# Patient Record
Sex: Female | Born: 1965 | Race: Black or African American | Hispanic: No | Marital: Married | Smoking: Never smoker
Health system: Southern US, Community
[De-identification: ages and names within clinical notes are randomized; demographics above are authoritative.]

## PROBLEM LIST (undated history)

## (undated) ENCOUNTER — Emergency Department (HOSPITAL_BASED_OUTPATIENT_CLINIC_OR_DEPARTMENT_OTHER): Payer: BC Managed Care – PPO | Source: Home / Self Care

## (undated) DIAGNOSIS — E119 Type 2 diabetes mellitus without complications: Secondary | ICD-10-CM

## (undated) DIAGNOSIS — C801 Malignant (primary) neoplasm, unspecified: Secondary | ICD-10-CM

## (undated) DIAGNOSIS — I10 Essential (primary) hypertension: Secondary | ICD-10-CM

## (undated) HISTORY — PX: ABDOMINAL HYSTERECTOMY: SHX81

---

## 2006-07-19 HISTORY — PX: BREAST SURGERY: SHX581

## 2009-04-02 ENCOUNTER — Ambulatory Visit: Payer: Self-pay | Admitting: Family Medicine

## 2009-04-02 DIAGNOSIS — I10 Essential (primary) hypertension: Secondary | ICD-10-CM | POA: Insufficient documentation

## 2009-04-02 DIAGNOSIS — J019 Acute sinusitis, unspecified: Secondary | ICD-10-CM | POA: Insufficient documentation

## 2010-06-29 ENCOUNTER — Ambulatory Visit
Admission: RE | Admit: 2010-06-29 | Discharge: 2010-06-30 | Payer: Self-pay | Source: Home / Self Care | Attending: Specialist | Admitting: Specialist

## 2010-09-29 LAB — BASIC METABOLIC PANEL
BUN: 8 mg/dL (ref 6–23)
Calcium: 9.3 mg/dL (ref 8.4–10.5)
GFR calc non Af Amer: >60 mL/min (ref >60)
Glucose, Bld: 99 mg/dL (ref 70–99)
Potassium: 5.1 mEq/L (ref 3.5–5.1)
Sodium: 138 mEq/L (ref 135–145)

## 2010-09-29 LAB — DIFFERENTIAL
Basophils Absolute: 0 10*3/uL (ref 0.0–0.1)
Basophils Relative: 0 % (ref 0–1)
Lymphocytes Relative: 38 % (ref 12–46)
Neutro Abs: 6 10*3/uL (ref 1.7–7.7)
Neutrophils Relative %: 55 % (ref 43–77)

## 2010-09-29 LAB — CBC
MCHC: 33.9 g/dL (ref 30.0–36.0)
RDW: 14 % (ref 11.5–15.5)
WBC: 10.8 10*3/uL — ABNORMAL HIGH (ref 4.0–10.5)

## 2010-09-29 LAB — POCT HEMOGLOBIN-HEMACUE: Hemoglobin: 14.5 g/dL (ref 12.0–15.0)

## 2010-11-02 ENCOUNTER — Ambulatory Visit (HOSPITAL_BASED_OUTPATIENT_CLINIC_OR_DEPARTMENT_OTHER): Admission: RE | Admit: 2010-11-02 | Payer: BC Managed Care – PPO | Source: Ambulatory Visit | Admitting: Specialist

## 2011-02-06 ENCOUNTER — Encounter: Payer: Self-pay | Admitting: *Deleted

## 2011-02-06 ENCOUNTER — Emergency Department (INDEPENDENT_AMBULATORY_CARE_PROVIDER_SITE_OTHER): Payer: BC Managed Care – PPO

## 2011-02-06 ENCOUNTER — Emergency Department (HOSPITAL_BASED_OUTPATIENT_CLINIC_OR_DEPARTMENT_OTHER)
Admission: EM | Admit: 2011-02-06 | Discharge: 2011-02-06 | Disposition: A | Discharge status: Home / Self Care | Payer: BC Managed Care – PPO | Attending: Emergency Medicine | Admitting: Emergency Medicine

## 2011-02-06 DIAGNOSIS — M25562 Pain in left knee: Secondary | ICD-10-CM

## 2011-02-06 DIAGNOSIS — M25569 Pain in unspecified knee: Secondary | ICD-10-CM | POA: Insufficient documentation

## 2011-02-06 HISTORY — DX: Malignant (primary) neoplasm, unspecified: C80.1

## 2011-02-06 MED ORDER — IBUPROFEN 800 MG PO TABS: 800.0000 mg | ORAL_TABLET | Freq: Three times a day (TID) | ORAL | Status: AC

## 2011-02-06 NOTE — ED Notes (Signed)
Pt states her left knee gave out on her earlier today.

## 2011-02-06 NOTE — ED Provider Notes (Signed)
History     Chief Complaint  Patient presents with  . Knee Pain   HPI Comments: Patient describes walking up the stairs several hours ago when her left knee "gave out". This happened acutely, and was followed by pain with flexing her knee. She has had no numbness, weakness, tingling of the left lower extremity. The pain is constant, mild at rest, moderate with movement of the knee. It is associated with mild swelling of the knee. She denies any other trauma to the knee. When asked about the location of the pain she points to the suprapatellar area.  Patient is a 45 y.o. female presenting with knee pain. The history is provided by the patient and a relative.  Knee Pain    Past Medical History  Diagnosis Date  . Cancer     Past Surgical History  Procedure Date  . Breast surgery   . Abdominal hysterectomy     History reviewed. No pertinent family history.  History  Substance Use Topics  . Smoking status: Never Smoker   . Smokeless tobacco: Not on file  . Alcohol Use: No    OB History    Grav Para Term Preterm Abortions TAB SAB Ect Mult Living                  Review of Systems  Musculoskeletal: Positive for joint swelling.  Neurological: Negative for weakness and numbness.    Physical Exam  BP 154/111  Pulse 100  Temp(Src) 99.3 F (37.4 C) (Oral)  Resp 20  Ht 5\' 5"  (1.651 m)  Wt 195 lb (88.451 kg)  BMI 32.45 kg/m2  SpO2 100%  Physical Exam  Nursing note and vitals reviewed. Constitutional: She appears well-developed and well-nourished.  HENT:  Head: Normocephalic and atraumatic.  Eyes: Conjunctivae are normal. No scleral icterus.  Cardiovascular: Normal rate and regular rhythm.   Pulmonary/Chest: Effort normal and breath sounds normal.  Musculoskeletal: She exhibits no edema. Tenderness:  Tenderness to palpation of the suprapatellar area. Patient is able to straight leg raise the left leg with minimal difficulty. She has slight decreased range of motion of  left knee secondary to pain. Clinical effusion or edema of the lower extremity on th.  Neurological: She is alert. Coordination normal.  Skin: Skin is warm and dry. No rash noted.    ED Course  Procedures  MDM Overall patient appears well without signs of infectious etiology for knee pain. There is no obvious trauma to the knee however I suspect there may be internal injury. The knee appears stable on anterior posterior exam and mediolateral stresses. Will obtain imaging to rule out other source however anticipate immobilization with Ace wrap, rest therapy, followup with orthopedics if not better in one week. She declines pain medication at this time due to taking ibuprofen just prior to arrival.  Imaging negative for fracture or dislocation. Immobilization with Ace wrap applied. Followup instructions given.   Dg Knee 1-2 Views Left  02/06/2011  *RADIOLOGY REPORT*  Clinical Data: Acute onset left knee pain.  LEFT KNEE - 1-2 VIEW  Comparison: None.  Findings: No displaced acute fracture or dislocation identified. No aggressive appearing osseous lesion.  No appreciable joint effusion.  IMPRESSION: No acute osseous abnormality.  Original Report Authenticated By: Waneta Martins, M.D.         Vida Roller, MD 02/06/11 [phone]

## 2012-09-03 ENCOUNTER — Emergency Department (HOSPITAL_BASED_OUTPATIENT_CLINIC_OR_DEPARTMENT_OTHER)
Admission: EM | Admit: 2012-09-03 | Discharge: 2012-09-03 | Disposition: A | Discharge status: Home / Self Care | Payer: BC Managed Care – PPO | Attending: Emergency Medicine | Admitting: Emergency Medicine

## 2012-09-03 ENCOUNTER — Encounter (HOSPITAL_BASED_OUTPATIENT_CLINIC_OR_DEPARTMENT_OTHER): Payer: Self-pay | Admitting: Emergency Medicine

## 2012-09-03 ENCOUNTER — Emergency Department (HOSPITAL_BASED_OUTPATIENT_CLINIC_OR_DEPARTMENT_OTHER): Payer: BC Managed Care – PPO

## 2012-09-03 DIAGNOSIS — R059 Cough, unspecified: Secondary | ICD-10-CM | POA: Insufficient documentation

## 2012-09-03 DIAGNOSIS — Z79899 Other long term (current) drug therapy: Secondary | ICD-10-CM | POA: Insufficient documentation

## 2012-09-03 DIAGNOSIS — R6883 Chills (without fever): Secondary | ICD-10-CM | POA: Insufficient documentation

## 2012-09-03 DIAGNOSIS — Z8709 Personal history of other diseases of the respiratory system: Secondary | ICD-10-CM | POA: Insufficient documentation

## 2012-09-03 DIAGNOSIS — J3489 Other specified disorders of nose and nasal sinuses: Secondary | ICD-10-CM | POA: Insufficient documentation

## 2012-09-03 DIAGNOSIS — R509 Fever, unspecified: Secondary | ICD-10-CM | POA: Insufficient documentation

## 2012-09-03 DIAGNOSIS — Z859 Personal history of malignant neoplasm, unspecified: Secondary | ICD-10-CM | POA: Insufficient documentation

## 2012-09-03 DIAGNOSIS — I10 Essential (primary) hypertension: Secondary | ICD-10-CM | POA: Insufficient documentation

## 2012-09-03 DIAGNOSIS — R05 Cough: Secondary | ICD-10-CM | POA: Insufficient documentation

## 2012-09-03 DIAGNOSIS — E669 Obesity, unspecified: Secondary | ICD-10-CM | POA: Insufficient documentation

## 2012-09-03 DIAGNOSIS — B9789 Other viral agents as the cause of diseases classified elsewhere: Secondary | ICD-10-CM

## 2012-09-03 HISTORY — DX: Essential (primary) hypertension: I10

## 2012-09-03 LAB — RAPID STREP SCREEN (MED CTR MEBANE ONLY): Streptococcus, Group A Screen (Direct): NEGATIVE

## 2012-09-03 MED ORDER — OXYMETAZOLINE HCL 0.05 % NA SOLN: 2.0000 | Freq: Two times a day (BID) | NASAL | Status: DC

## 2012-09-03 MED ORDER — AZITHROMYCIN 250 MG PO TABS: ORAL_TABLET | ORAL | Status: DC

## 2012-09-03 MED ORDER — MOMETASONE FUROATE 50 MCG/ACT NA SUSP: 2.0000 | Freq: Every day | NASAL | Status: DC

## 2012-09-03 NOTE — ED Provider Notes (Signed)
History     CSN: [id number]  Arrival date & time 09/03/12  4098   First MD Initiated Contact with Patient 09/03/12 1016      Chief Complaint  Patient presents with  . Sore Throat  . Nasal Congestion    (Consider location/radiation/quality/duration/timing/severity/associated sxs/prior treatment) HPI Comments: 47 y.o PMH sinusitis, HTN she presents with sore throat 8/10.  Wednesday her throat was scratchy and progressed to being sore.  She also has sensation of ears popping, nasal congestion, and runny nose with clear to yellow d/c, cough with clear sputum.  She denies sick contacts.    Patient is a 47 y.o. female presenting with pharyngitis. The history is provided by the patient. No language interpreter was used.  Sore Throat This is a new problem. The current episode started in the past 7 days. Associated symptoms include congestion, coughing, a fever and a sore throat. Pertinent negatives include no abdominal pain or chills.    Past Medical History  Diagnosis Date  . Cancer   . Hypertension     Past Surgical History  Procedure Laterality Date  . Breast surgery    . Abdominal hysterectomy      No family history on file.  History  Substance Use Topics  . Smoking status: Never Smoker   . Smokeless tobacco: Not on file  . Alcohol Use: No    OB History   Grav Para Term Preterm Abortions TAB SAB Ect Mult Living                  Review of Systems  Constitutional: Positive for fever. Negative for chills.  HENT: Positive for congestion and sore throat.   Respiratory: Positive for cough. Negative for shortness of breath.   Gastrointestinal: Negative for abdominal pain.  All other systems reviewed and are negative.    Allergies  Amoxicillin-pot clavulanate  Home Medications   Current Outpatient Rx  Name  Route  Sig  Dispense  Refill  . ibuprofen (ADVIL,MOTRIN) 800 MG tablet   Oral   Take 800 mg by mouth once. pain          . Prenatal Vit-Fe Psac Cmplx-FA  (PRENATAL MULTIVITAMIN) 60-1 MG tablet   Oral   Take 1 tablet by mouth daily with breakfast.           . Pseudoeph-Doxylamine-DM-APAP (NYQUIL MULTI-SYMPTOM PO)   Oral   Take by mouth.         . valsartan-hydrochlorothiazide (DIOVAN-HCT) 320-12.5 MG per tablet   Oral   Take 1 tablet by mouth daily.         Marland Kitchen azithromycin (ZITHROMAX) 250 MG tablet      Take 500 mg day one, then 250 mg until finished  Generic okay   6 each   0   . mometasone (NASONEX) 50 MCG/ACT nasal spray   Nasal   Place 2 sprays into the nose daily.   17 g   0   . oxymetazoline (AFRIN 12 HOUR) 0.05 % nasal spray   Nasal   Place 2 sprays into the nose 2 (two) times daily.   30 mL   0     BP 158/98  Pulse 102  Temp(Src) 97.9 F (36.6 C) (Oral)  Resp 16  Ht 5\' 4"  (1.626 m)  Wt 185 lb (83.915 kg)  BMI 31.74 kg/m2  SpO2 100%  Physical Exam  Nursing note and vitals reviewed. Constitutional: She is oriented to person, place, and time. She appears well-developed and well-nourished.  She is cooperative. No distress.  HENT:  Head: Normocephalic and atraumatic.  Right Ear: External ear normal.  Left Ear: External ear normal.  Nose: Right sinus exhibits maxillary sinus tenderness and frontal sinus tenderness. Left sinus exhibits maxillary sinus tenderness and frontal sinus tenderness.  Mouth/Throat: Oropharynx is clear and moist and mucous membranes are normal. No oropharyngeal exudate.  B/l ethmoid ttp  Nares with erythema and congestion  Eyes: Conjunctivae are normal. Pupils are equal, round, and reactive to light. Right eye exhibits no discharge. Left eye exhibits no discharge. No scleral icterus.  Cardiovascular: Regular rhythm.  Tachycardia present.   No murmur heard. Pulmonary/Chest: Effort normal and breath sounds normal. No respiratory distress. She has no wheezes.  Abdominal: Soft. Bowel sounds are normal. She exhibits no distension. There is no tenderness.  Obese ab  Lymphadenopathy:        Head (right side): No submental, no submandibular, no tonsillar and no occipital adenopathy present.       Head (left side): No submental, no submandibular, no tonsillar and no occipital adenopathy present.    She has no cervical adenopathy.  Neurological: She is alert and oriented to person, place, and time.  Skin: Skin is warm, dry and intact. No rash noted. She is not diaphoretic.  Psychiatric: She has a normal mood and affect. Her speech is normal and behavior is normal. Judgment and thought content normal. Cognition and memory are normal.    ED Course  Procedures (including critical care time)  Labs Reviewed  RAPID STREP SCREEN   Dg Chest 2 View  09/03/2012  *RADIOLOGY REPORT*  Clinical Data: Sore throat, congestion, cough.  History breast cancer.  CHEST - 2 VIEW  Comparison: None.  Findings: Prior surgical changes within the right breast and chest wall.  Slight peribronchial lead that mild peribronchial thickening.  Heart is normal size.  No effusions or acute bony abnormality.  IMPRESSION: Mild bronchitic changes.   Original Report Authenticated By: Charlett Nose, M.D.      1. Viral respiratory illness       MDM  Viral vs sinusitis  Rapid strep negative  CXR bronchitic changes  Establish PCP Nasonex, Afrin, AZM Dorinda Hill MD [phone]         Annett Gula, MD 09/03/12 [phone]

## 2012-09-03 NOTE — ED Notes (Signed)
Sore throat, nasal congestion, chills for 3 days.

## 2012-09-03 NOTE — ED Provider Notes (Signed)
I saw and evaluated the patient, reviewed the resident's note and I agree with the findings and plan.   Jadia Capers, MD 09/03/12 1413 

## 2012-09-19 ENCOUNTER — Emergency Department (HOSPITAL_BASED_OUTPATIENT_CLINIC_OR_DEPARTMENT_OTHER)
Admission: EM | Admit: 2012-09-19 | Discharge: 2012-09-19 | Disposition: A | Discharge status: Home / Self Care | Payer: BC Managed Care – PPO | Attending: Emergency Medicine | Admitting: Emergency Medicine

## 2012-09-19 ENCOUNTER — Encounter (HOSPITAL_BASED_OUTPATIENT_CLINIC_OR_DEPARTMENT_OTHER): Payer: Self-pay | Admitting: *Deleted

## 2012-09-19 DIAGNOSIS — R51 Headache: Secondary | ICD-10-CM | POA: Insufficient documentation

## 2012-09-19 DIAGNOSIS — I10 Essential (primary) hypertension: Secondary | ICD-10-CM | POA: Insufficient documentation

## 2012-09-19 DIAGNOSIS — R197 Diarrhea, unspecified: Secondary | ICD-10-CM | POA: Insufficient documentation

## 2012-09-19 DIAGNOSIS — R112 Nausea with vomiting, unspecified: Secondary | ICD-10-CM | POA: Insufficient documentation

## 2012-09-19 DIAGNOSIS — Z90710 Acquired absence of both cervix and uterus: Secondary | ICD-10-CM | POA: Insufficient documentation

## 2012-09-19 DIAGNOSIS — Z859 Personal history of malignant neoplasm, unspecified: Secondary | ICD-10-CM | POA: Insufficient documentation

## 2012-09-19 DIAGNOSIS — R1110 Vomiting, unspecified: Secondary | ICD-10-CM

## 2012-09-19 DIAGNOSIS — R509 Fever, unspecified: Secondary | ICD-10-CM | POA: Insufficient documentation

## 2012-09-19 DIAGNOSIS — Z79899 Other long term (current) drug therapy: Secondary | ICD-10-CM | POA: Insufficient documentation

## 2012-09-19 LAB — BASIC METABOLIC PANEL
CO2: 24 mEq/L (ref 19–32)
Calcium: 9.3 mg/dL (ref 8.4–10.5)
Creatinine, Ser: 0.9 mg/dL (ref 0.50–1.10)
GFR calc Af Amer: 89 mL/min — ABNORMAL LOW (ref >90)
GFR calc non Af Amer: 77 mL/min — ABNORMAL LOW (ref >90)

## 2012-09-19 LAB — URINALYSIS, ROUTINE W REFLEX MICROSCOPIC
Ketones, ur: NEGATIVE mg/dL
Leukocytes, UA: NEGATIVE
Nitrite: NEGATIVE
Protein, ur: NEGATIVE mg/dL
Urobilinogen, UA: 0.2 mg/dL (ref 0.0–1.0)

## 2012-09-19 LAB — URINE MICROSCOPIC-ADD ON

## 2012-09-19 MED ORDER — ONDANSETRON 4 MG PO TBDP: 4.0000 mg | ORAL_TABLET | Freq: Three times a day (TID) | ORAL | Status: DC | PRN

## 2012-09-19 MED ORDER — ONDANSETRON HCL 4 MG/2ML IJ SOLN
4.0000 mg | Freq: Once | INTRAMUSCULAR | Status: AC
  Administered 2012-09-19: 4 mg via INTRAVENOUS
  Filled 2012-09-19: qty 2

## 2012-09-19 MED ORDER — SODIUM CHLORIDE 0.9 % IV BOLUS (SEPSIS)
1000.0000 mL | Freq: Once | INTRAVENOUS | Status: AC
  Administered 2012-09-19: 1000 mL via INTRAVENOUS

## 2012-09-19 NOTE — ED Provider Notes (Signed)
Medical screening examination/treatment/procedure(s) were performed by non-physician practitioner and as supervising physician I was immediately available for consultation/collaboration.   Gwyneth Sprout, MD 09/19/12 2055

## 2012-09-19 NOTE — ED Notes (Signed)
Pt states she feels much better after having received zofran 4mg  ivp, and 1L ns bolus.  Pt states that she is ready to go home and has requested a work note.

## 2012-09-19 NOTE — ED Provider Notes (Signed)
History     CSN: [id number]  Arrival date & time 09/19/12  1647   First MD Initiated Contact with Patient 09/19/12 1705      Chief Complaint  Patient presents with  . Emesis    (Consider location/radiation/quality/duration/timing/severity/associated sxs/prior treatment) HPI Comments: Pt states that the vomiting and diarrhea have slowed today but she is now having chills fever and headache  Patient is a 47 y.o. female presenting with vomiting. The history is provided by the patient.  Emesis Severity:  Moderate Duration:  2 days Timing:  Intermittent Able to tolerate:  Liquids Progression:  Improving Relieved by:  Nothing Worsened by:  Nothing tried Ineffective treatments:  None tried Associated symptoms: chills, diarrhea and fever   Associated symptoms: no cough     Past Medical History  Diagnosis Date  . Cancer   . Hypertension     Past Surgical History  Procedure Laterality Date  . Breast surgery    . Abdominal hysterectomy      No family history on file.  History  Substance Use Topics  . Smoking status: Never Smoker   . Smokeless tobacco: Not on file  . Alcohol Use: No    OB History   Grav Para Term Preterm Abortions TAB SAB Ect Mult Living                  Review of Systems  Constitutional: Positive for chills.  Respiratory: Negative.   Cardiovascular: Negative.   Gastrointestinal: Positive for vomiting and diarrhea.    Allergies  Amoxicillin-pot clavulanate  Home Medications   Current Outpatient Rx  Name  Route  Sig  Dispense  Refill  . azithromycin (ZITHROMAX) 250 MG tablet      Take 500 mg day one, then 250 mg until finished  Generic okay   6 each   0   . ibuprofen (ADVIL,MOTRIN) 800 MG tablet   Oral   Take 800 mg by mouth once. pain          . mometasone (NASONEX) 50 MCG/ACT nasal spray   Nasal   Place 2 sprays into the nose daily.   17 g   0   . oxymetazoline (AFRIN 12 HOUR) 0.05 % nasal spray   Nasal   Place 2 sprays  into the nose 2 (two) times daily.   30 mL   0   . Prenatal Vit-Fe Psac Cmplx-FA (PRENATAL MULTIVITAMIN) 60-1 MG tablet   Oral   Take 1 tablet by mouth daily with breakfast.           . Pseudoeph-Doxylamine-DM-APAP (NYQUIL MULTI-SYMPTOM PO)   Oral   Take by mouth.         . valsartan-hydrochlorothiazide (DIOVAN-HCT) 320-12.5 MG per tablet   Oral   Take 1 tablet by mouth daily.           BP 138/84  Pulse 119  Temp(Src) 99.1 F (37.3 C) (Oral)  Resp 16  Wt 185 lb (83.915 kg)  BMI 31.74 kg/m2  SpO2 99%  Physical Exam  Nursing note and vitals reviewed. Constitutional: She is oriented to person, place, and time. She appears well-developed and well-nourished.  HENT:  Right Ear: External ear normal.  Left Ear: External ear normal.  Mouth/Throat: Oropharynx is clear and moist.  Eyes: Conjunctivae are normal.  Cardiovascular: Normal rate and regular rhythm.   Pulmonary/Chest: Effort normal and breath sounds normal.  Abdominal: Soft. Bowel sounds are normal. There is no tenderness.  Musculoskeletal: Normal range of motion.  Neurological: She is alert and oriented to person, place, and time.  Skin: Skin is warm and dry.  Psychiatric: She has a normal mood and affect.    ED Course  Procedures (including critical care time)  Labs Reviewed  URINALYSIS, ROUTINE W REFLEX MICROSCOPIC - Abnormal; Notable for the following:    APPearance CLOUDY (*)    Hgb urine dipstick MODERATE (*)    All other components within normal limits  BASIC METABOLIC PANEL - Abnormal; Notable for the following:    Glucose, Bld 106 (*)    GFR calc non Af Amer 77 (*)    GFR calc Af Amer 89 (*)    All other components within normal limits  URINE MICROSCOPIC-ADD ON - Abnormal; Notable for the following:    Squamous Epithelial / LPF FEW (*)    Bacteria, UA FEW (*)    All other components within normal limits  URINE CULTURE   No results found.   1. Vomiting and diarrhea       MDM  Pt is  tolerating po and feeling better at this time:symptoms likely viral:abdomen benign don't think imaging is needed at this time        Teressa Lower, NP 09/19/12 1855

## 2012-09-19 NOTE — ED Notes (Signed)
Vomiting, chills, fever and headache x 2 days.

## 2012-09-20 LAB — URINE CULTURE

## 2013-08-07 ENCOUNTER — Emergency Department (HOSPITAL_BASED_OUTPATIENT_CLINIC_OR_DEPARTMENT_OTHER)
Admission: EM | Admit: 2013-08-07 | Discharge: 2013-08-07 | Disposition: A | Discharge status: Home / Self Care | Payer: BC Managed Care – PPO | Attending: Emergency Medicine | Admitting: Emergency Medicine

## 2013-08-07 ENCOUNTER — Emergency Department (HOSPITAL_BASED_OUTPATIENT_CLINIC_OR_DEPARTMENT_OTHER): Payer: BC Managed Care – PPO

## 2013-08-07 ENCOUNTER — Encounter (HOSPITAL_BASED_OUTPATIENT_CLINIC_OR_DEPARTMENT_OTHER): Payer: Self-pay | Admitting: Emergency Medicine

## 2013-08-07 DIAGNOSIS — IMO0002 Reserved for concepts with insufficient information to code with codable children: Secondary | ICD-10-CM | POA: Insufficient documentation

## 2013-08-07 DIAGNOSIS — Z853 Personal history of malignant neoplasm of breast: Secondary | ICD-10-CM | POA: Insufficient documentation

## 2013-08-07 DIAGNOSIS — Z79899 Other long term (current) drug therapy: Secondary | ICD-10-CM | POA: Insufficient documentation

## 2013-08-07 DIAGNOSIS — Z792 Long term (current) use of antibiotics: Secondary | ICD-10-CM | POA: Insufficient documentation

## 2013-08-07 DIAGNOSIS — I10 Essential (primary) hypertension: Secondary | ICD-10-CM | POA: Insufficient documentation

## 2013-08-07 DIAGNOSIS — Z791 Long term (current) use of non-steroidal anti-inflammatories (NSAID): Secondary | ICD-10-CM | POA: Insufficient documentation

## 2013-08-07 DIAGNOSIS — Z9889 Other specified postprocedural states: Secondary | ICD-10-CM | POA: Insufficient documentation

## 2013-08-07 DIAGNOSIS — I890 Lymphedema, not elsewhere classified: Secondary | ICD-10-CM

## 2013-08-07 NOTE — ED Notes (Signed)
Right arm pain for several months. States she had breast reconstruction after breast cancer 3 years ago. Her implant started to leak and she cannot afford to have it repaired. Thinks the pain in her right arm is related to the leaking. She feels her upper arm is slightly swollen. Good range of motion. Radial pulses palpated.

## 2013-08-07 NOTE — ED Notes (Signed)
[place] will be available at 1300

## 2013-08-07 NOTE — ED Provider Notes (Signed)
CSN: [id number]     Arrival date & time 08/07/13  1130 History   First MD Initiated Contact with Patient 08/07/13 1137     Chief Complaint  Patient presents with  . Arm Pain   (Consider location/radiation/quality/duration/timing/severity/associated sxs/prior Treatment) HPI Comments: Patient presents to the ER for evaluation of right arm swelling. She says she has been having pain and swelling, noticed for last several months. Patient has a history of mastectomy with breast reconstruction. She reports that in the last several months she has noticed that the right saline breast implant has been leaking. She thinks this might be related to the pain. She denies any injury. No skin changes.  Patient is a 48 y.o. female presenting with arm pain.  Arm Pain    Past Medical History  Diagnosis Date  . Cancer   . Hypertension    Past Surgical History  Procedure Laterality Date  . Breast surgery    . Abdominal hysterectomy     No family history on file. History  Substance Use Topics  . Smoking status: Never Smoker   . Smokeless tobacco: Not on file  . Alcohol Use: No   OB History   Grav Para Term Preterm Abortions TAB SAB Ect Mult Living                 Review of Systems  Respiratory: Negative.   Cardiovascular: Negative.   Musculoskeletal:       Arm pain   Skin: Negative.     Allergies  Amoxicillin-pot clavulanate  Home Medications   Current Outpatient Rx  Name  Route  Sig  Dispense  Refill  . azithromycin (ZITHROMAX) 250 MG tablet      Take 500 mg day one, then 250 mg until finished  Generic okay   6 each   0   . ibuprofen (ADVIL,MOTRIN) 800 MG tablet   Oral   Take 800 mg by mouth once. pain          . mometasone (NASONEX) 50 MCG/ACT nasal spray   Nasal   Place 2 sprays into the nose daily.   17 g   0   . ondansetron (ZOFRAN ODT) 4 MG disintegrating tablet   Oral   Take 1 tablet (4 mg total) by mouth every 8 (eight) hours as needed for nausea.   20  tablet   0   . oxymetazoline (AFRIN 12 HOUR) 0.05 % nasal spray   Nasal   Place 2 sprays into the nose 2 (two) times daily.   30 mL   0   . Prenatal Vit-Fe Psac Cmplx-FA (PRENATAL MULTIVITAMIN) 60-1 MG tablet   Oral   Take 1 tablet by mouth daily with breakfast.           . Pseudoeph-Doxylamine-DM-APAP (NYQUIL MULTI-SYMPTOM PO)   Oral   Take by mouth.         . valsartan-hydrochlorothiazide (DIOVAN-HCT) 320-12.5 MG per tablet   Oral   Take 1 tablet by mouth daily.          BP 188/115  Pulse 110  Temp(Src) 98.3 F (36.8 C) (Oral)  Resp 20  Ht 5\' 5"  (1.651 m)  Wt 185 lb (83.915 kg)  BMI 30.79 kg/m2  SpO2 100% Physical Exam  Constitutional: She is oriented to person, place, and time. She appears well-developed and well-nourished. No distress.  HENT:  Head: Normocephalic and atraumatic.  Right Ear: Hearing normal.  Left Ear: Hearing normal.  Nose: Nose normal.  Mouth/Throat:  Oropharynx is clear and moist and mucous membranes are normal.  Eyes: Conjunctivae and EOM are normal. Pupils are equal, round, and reactive to light.  Neck: Normal range of motion. Neck supple.  Cardiovascular: Regular rhythm, S1 normal and S2 normal.  Exam reveals no gallop and no friction rub.   No murmur heard. Pulses:      Radial pulses are 2+ on the right side.  Pulmonary/Chest: Effort normal and breath sounds normal. No respiratory distress. She exhibits no tenderness.  Abdominal: Soft. Normal appearance and bowel sounds are normal. There is no hepatosplenomegaly. There is no tenderness. There is no rebound, no guarding, no tenderness at McBurney's point and negative Murphy's sign. No hernia.  Musculoskeletal: Normal range of motion. She exhibits no edema.  Neurological: She is alert and oriented to person, place, and time. She has normal strength. No cranial nerve deficit or sensory deficit. Coordination normal. GCS eye subscore is 4. GCS verbal subscore is 5. GCS motor subscore is 6.   Skin: Skin is warm, dry and intact. No rash noted. No cyanosis.  Psychiatric: She has a normal mood and affect. Her speech is normal and behavior is normal. Thought content normal.    ED Course  Procedures (including critical care time) Labs Review Labs Reviewed - No data to display Imaging Review No results found.  EKG Interpretation   None       MDM  Diagnosis: Right arm swelling, possibly lymphedema  Patient presents to the ER for evaluation of right arm swelling with pain. Patient has had previous mastectomy with breast reconstruction. She reports problems with the implant on the right side, will likely be having repeat surgery. Her last several months she has had some swelling and pain of the arm. Examination reveals normal pulses. There is no overlying skin changes to suggest infection. Ultrasound was performed and there is no evidence of DVT. Patient was reassured, no interventions are necessary at this time.  Orpah Greek, MD 08/07/13 [phone]

## 2013-08-07 NOTE — Discharge Instructions (Signed)
Edema °Edema is a buildup of fluids. It is most common in the feet, ankles, and legs. This happens more as a person ages. It may affect one or both legs. °HOME CARE  °· Raise (elevate) the legs or ankles above the level of the heart while lying down. °· Avoid sitting or standing still for a long time. °· Exercise the legs to help the puffiness (swelling) go down. °· A low-salt diet may help lessen the puffiness. °· Only take medicine as told by your doctor. °GET HELP RIGHT AWAY IF:  °· You develop shortness of breath or chest pain. °· You cannot breathe when you lie down. °· You have more puffiness that does not go away with treatment. °· You develop pain or redness in the areas that are puffy. °· You have a temperature by mouth above 102° F (38.9° C), not controlled by medicine. °· You gain 03 lb/1.4 kg or more in 1 day or 05 lb/2.3 kg in a week. °MAKE SURE YOU:  °· Understand these instructions. °· Will watch your condition. °· Will get help right away if you are not doing well or get worse. °Document Released: 12/22/2007 Document Revised: 09/27/2011 Document Reviewed: 12/22/2007 °[employer]® Patient Information ©2014 [employer]. ° °

## 2014-05-06 ENCOUNTER — Emergency Department (HOSPITAL_BASED_OUTPATIENT_CLINIC_OR_DEPARTMENT_OTHER)
Admission: EM | Admit: 2014-05-06 | Discharge: 2014-05-06 | Disposition: A | Discharge status: Home / Self Care | Payer: BC Managed Care – PPO | Attending: Emergency Medicine | Admitting: Emergency Medicine

## 2014-05-06 ENCOUNTER — Encounter (HOSPITAL_BASED_OUTPATIENT_CLINIC_OR_DEPARTMENT_OTHER): Payer: Self-pay | Admitting: Emergency Medicine

## 2014-05-06 DIAGNOSIS — Z79899 Other long term (current) drug therapy: Secondary | ICD-10-CM | POA: Insufficient documentation

## 2014-05-06 DIAGNOSIS — R350 Frequency of micturition: Secondary | ICD-10-CM | POA: Insufficient documentation

## 2014-05-06 DIAGNOSIS — S39012A Strain of muscle, fascia and tendon of lower back, initial encounter: Secondary | ICD-10-CM | POA: Insufficient documentation

## 2014-05-06 DIAGNOSIS — I10 Essential (primary) hypertension: Secondary | ICD-10-CM | POA: Insufficient documentation

## 2014-05-06 DIAGNOSIS — Z87828 Personal history of other (healed) physical injury and trauma: Secondary | ICD-10-CM | POA: Insufficient documentation

## 2014-05-06 DIAGNOSIS — Y9389 Activity, other specified: Secondary | ICD-10-CM | POA: Insufficient documentation

## 2014-05-06 DIAGNOSIS — Y929 Unspecified place or not applicable: Secondary | ICD-10-CM | POA: Insufficient documentation

## 2014-05-06 DIAGNOSIS — Z7951 Long term (current) use of inhaled steroids: Secondary | ICD-10-CM | POA: Insufficient documentation

## 2014-05-06 DIAGNOSIS — X58XXXA Exposure to other specified factors, initial encounter: Secondary | ICD-10-CM | POA: Insufficient documentation

## 2014-05-06 LAB — URINALYSIS, ROUTINE W REFLEX MICROSCOPIC
Bilirubin Urine: NEGATIVE
Glucose, UA: NEGATIVE mg/dL
Ketones, ur: NEGATIVE mg/dL
Leukocytes, UA: NEGATIVE
NITRITE: NEGATIVE
PH: 5.5 (ref 5.0–8.0)
Protein, ur: NEGATIVE mg/dL
SPECIFIC GRAVITY, URINE: 1.021 (ref 1.005–1.030)
Urobilinogen, UA: 0.2 mg/dL (ref 0.0–1.0)

## 2014-05-06 LAB — URINE MICROSCOPIC-ADD ON

## 2014-05-06 MED ORDER — CYCLOBENZAPRINE HCL 10 MG PO TABS: 10.0000 mg | ORAL_TABLET | Freq: Two times a day (BID) | ORAL | Status: DC | PRN

## 2014-05-06 MED ORDER — VALSARTAN-HYDROCHLOROTHIAZIDE 320-12.5 MG PO TABS
1.0000 | ORAL_TABLET | Freq: Every day | ORAL | Status: AC
Start: 2014-05-06 — End: ?

## 2014-05-06 NOTE — ED Notes (Signed)
PA at bedside.

## 2014-05-06 NOTE — Discharge Instructions (Signed)

## 2014-05-06 NOTE — ED Notes (Signed)
Low back pain radiating to right buttock that started Thursday.  Also reports urinary frequency.

## 2014-05-06 NOTE — ED Provider Notes (Signed)
CSN: [id number]     Arrival date & time 05/06/14  3532 History   First MD Initiated Contact with Patient 05/06/14 0911     Chief Complaint  Patient presents with  . Back Pain     (Consider location/radiation/quality/duration/timing/severity/associated sxs/prior Treatment) HPI  Patient to the ER for evaluation of low back pain that started on Thursday. 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 She at first was concerned that it was kidney related and started to drink much more water and cranberry juice since increasing her PO intake she has been having increased urinary frequency. Denies incontinence or hesitancy. Her pain is in the right lower lumbar region. No midline, no lower extremity weakness, No numbness. NO fevers, nausea, vomiting or diarrhea. Denies headache and vaginal discharge. She has been taking Tylenol at night for the pain with moderate improvement but pain returns the next day.  BP is elevated in triage, she reports being out of her BP medications  Past Medical History  Diagnosis Date  . Cancer   . Hypertension    Past Surgical History  Procedure Laterality Date  . Breast surgery    . Abdominal hysterectomy     No family history on file. History  Substance Use Topics  . Smoking status: Never Smoker   . Smokeless tobacco: Not on file  . Alcohol Use: No   OB History   Grav Para Term Preterm Abortions TAB SAB Ect Mult Living                 Review of Systems   Review of Systems  Gen: no weight loss, fevers, chills, night sweats  Eyes: no occular draining, occular pain,  No visual changes  Nose: no epistaxis or rhinorrhea  Mouth: no dental pain, no sore throat  Neck: no neck pain  Lungs: No hemoptysis. No wheezing or coughing CV:  No palpitations, dependent edema or orthopnea. No chest pain Abd: no diarrhea. No nausea or vomiting, No abdominal pain  GU: no dysuria or gross hematuria + urinary frequency MSK:   No muscle weakness, + muscular pain Neuro: no headache, no focal neurologic deficits  Skin: no rash , no wounds Psyche: no complaints of depression or anxiety    Allergies  Amoxicillin-pot clavulanate  Home Medications   Prior to Admission medications   Medication Sig Start Date End Date Taking? Authorizing Provider  azithromycin (ZITHROMAX) 250 MG tablet Take 500 mg day one, then 250 mg until finished  Generic okay 09/03/12   Cresenciano Genre, MD  cyclobenzaprine (FLEXERIL) 10 MG tablet Take 1 tablet (10 mg total) by mouth 2 (two) times daily as needed for muscle spasms. 05/06/14   Grady Mohabir Marilu Favre, PA-C  ibuprofen (ADVIL,MOTRIN) 800 MG tablet Take 800 mg by mouth once. pain     Historical Provider, MD  mometasone (NASONEX) 50 MCG/ACT nasal spray Place 2 sprays into the nose daily. 09/03/12   Cresenciano Genre, MD  ondansetron (ZOFRAN ODT) 4 MG disintegrating tablet Take 1 tablet (4 mg total) by mouth every 8 (eight) hours as needed for nausea. 09/19/12   Glendell Docker, NP  oxymetazoline (AFRIN 12 HOUR) 0.05 % nasal spray Place 2 sprays into the nose 2 (two) times daily. 09/03/12   Cresenciano Genre, MD  Prenatal Vit-Fe Psac Cmplx-FA (PRENATAL MULTIVITAMIN) 60-1 MG tablet Take 1 tablet by mouth daily with breakfast.      Historical Provider, MD  Pseudoeph-Doxylamine-DM-APAP (NYQUIL  MULTI-SYMPTOM PO) Take by mouth.    Historical Provider, MD  valsartan-hydrochlorothiazide (DIOVAN-HCT) 320-12.5 MG per tablet Take 1 tablet by mouth daily.    Historical Provider, MD   BP 162/101  Pulse 106  Temp(Src) 99 F (37.2 C) (Oral)  Resp 16  Ht 5\' 4"  (1.626 m)  Wt 187 lb (84.823 kg)  BMI 32.08 kg/m2  SpO2 100% Physical Exam  Nursing note and vitals reviewed. Constitutional: She appears well-developed and well-nourished. No distress.  HENT:  Head: Normocephalic and atraumatic.  Eyes: Pupils are equal, round, and reactive to light.  Neck: Normal range of motion. Neck supple.  Cardiovascular: Normal  rate and regular rhythm.   Pulmonary/Chest: Effort normal.  Abdominal: Soft.  Musculoskeletal:       Back:  Pt has equal strength to bilateral lower extremities.  Neurosensory function adequate to both legs No clonus on dorsiflextion Skin color is normal. Skin is warm and moist.  I see no step off deformity, no midline bony tenderness.  Pt is able to ambulate.  No crepitus, laceration, effusion, induration, lesions, swelling.   Pedal pulses are symmetrical and palpable bilaterally  No tenderness to palpation of  paraspinel or midline muscles   Neurological: She is alert.  Skin: Skin is warm and dry.    ED Course  Procedures (including critical care time) Labs Review Labs Reviewed  URINALYSIS, ROUTINE W REFLEX MICROSCOPIC - Abnormal; Notable for the following:    Hgb urine dipstick TRACE (*)    All other components within normal limits  URINE MICROSCOPIC-ADD ON - Abnormal; Notable for the following:    Squamous Epithelial / LPF FEW (*)    Bacteria, UA FEW (*)    All other components within normal limits    Imaging Review No results found.   EKG Interpretation None      MDM   Final diagnoses:  Low back strain, initial encounter    Pt has increased urinary frequency but has had increased fluid intake. No glucose spilling into her urine.NO ketones. Her symptoms are consistent with musculature injury getting worse when she moves. Ambulatory without any abnormalities. Offered pain meds and muscle relaxers in the ED but she declined.  She has missed work and needs a note.  Rx: Flexeril, continue NSAIDs at home. Referral to ortho. Refill BP medications DIOVAN 320-12.5mg   48 y.o.Tanaia Scaff's  with back pain. No neurological deficits and normal neuro exam. Patient can walk. No loss of bowel or bladder control. No concern for cauda equina at this time base on HPI and physical exam findings. No fever, night sweats, weight loss, h/o cancer, IVDU.   RICE protocol and  pain medicine indicated and discussed with patient.   Patient Plan 1. Medications: pain medication and muscle relaxer. Cont usual home medications unless otherwise directed. 2. Treatment: rest, drink plenty of fluids, gentle stretching as discussed, alternate ice and heat  3. Follow Up: Please followup with your primary doctor for discussion of your diagnoses and further evaluation after today's visit; if you do not have a primary care doctor use the resource guide provided to find one   Vital signs are stable at discharge. Filed Vitals:   05/06/14 0908  BP: 162/101  Pulse: 106  Temp: 99 F (37.2 C)  Resp: 16    Patient/guardian has voiced understanding and agreed to follow-up with the PCP or specialist.         Linus Mako, PA-C 05/06/14 0945

## 2014-05-06 NOTE — ED Provider Notes (Signed)
Medical screening examination/treatment/procedure(s) were performed by non-physician practitioner and as supervising physician I was immediately available for consultation/collaboration.   EKG Interpretation None        Malvin Johns, MD 05/06/14 [id number]

## 2014-06-25 ENCOUNTER — Encounter (HOSPITAL_BASED_OUTPATIENT_CLINIC_OR_DEPARTMENT_OTHER): Payer: Self-pay

## 2014-06-25 ENCOUNTER — Emergency Department (HOSPITAL_BASED_OUTPATIENT_CLINIC_OR_DEPARTMENT_OTHER)
Admission: EM | Admit: 2014-06-25 | Discharge: 2014-06-25 | Disposition: A | Discharge status: Home / Self Care | Payer: BC Managed Care – PPO | Attending: Emergency Medicine | Admitting: Emergency Medicine

## 2014-06-25 DIAGNOSIS — Z880 Allergy status to penicillin: Secondary | ICD-10-CM | POA: Insufficient documentation

## 2014-06-25 DIAGNOSIS — R0981 Nasal congestion: Secondary | ICD-10-CM | POA: Diagnosis present

## 2014-06-25 DIAGNOSIS — J019 Acute sinusitis, unspecified: Secondary | ICD-10-CM | POA: Insufficient documentation

## 2014-06-25 DIAGNOSIS — Z859 Personal history of malignant neoplasm, unspecified: Secondary | ICD-10-CM | POA: Insufficient documentation

## 2014-06-25 DIAGNOSIS — I10 Essential (primary) hypertension: Secondary | ICD-10-CM | POA: Diagnosis not present

## 2014-06-25 DIAGNOSIS — Z7952 Long term (current) use of systemic steroids: Secondary | ICD-10-CM | POA: Insufficient documentation

## 2014-06-25 DIAGNOSIS — Z792 Long term (current) use of antibiotics: Secondary | ICD-10-CM | POA: Diagnosis not present

## 2014-06-25 MED ORDER — DOXYCYCLINE HYCLATE 100 MG PO CAPS: 100.0000 mg | ORAL_CAPSULE | Freq: Two times a day (BID) | ORAL | Status: DC

## 2014-06-25 NOTE — ED Notes (Signed)
Pt reports nasal congestion x 1 week. Sts trying OTC meds with no relief. Denies fever, chest pain. Denies sick contacts.

## 2014-06-25 NOTE — Discharge Instructions (Signed)
Take Afrin nasal spray, available over the counter for the next three days.  Continue taking Mucinex and Flonase.   Sinusitis Sinusitis is redness, soreness, and inflammation of the paranasal sinuses. Paranasal sinuses are air pockets within the bones of your face (beneath the eyes, the middle of the forehead, or above the eyes). In healthy paranasal sinuses, mucus is able to drain out, and air is able to circulate through them by way of your nose. However, when your paranasal sinuses are inflamed, mucus and air can become trapped. This can allow bacteria and other germs to grow and cause infection. Sinusitis can develop quickly and last only a short time (acute) or continue over a long period (chronic). Sinusitis that lasts for more than 12 weeks is considered chronic.  CAUSES  Causes of sinusitis include:  Allergies.  Structural abnormalities, such as displacement of the cartilage that separates your nostrils (deviated septum), which can decrease the air flow through your nose and sinuses and affect sinus drainage.  Functional abnormalities, such as when the small hairs (cilia) that line your sinuses and help remove mucus do not work properly or are not present. SIGNS AND SYMPTOMS  Symptoms of acute and chronic sinusitis are the same. The primary symptoms are pain and pressure around the affected sinuses. Other symptoms include:  Upper toothache.  Earache.  Headache.  Bad breath.  Decreased sense of smell and taste.  A cough, which worsens when you are lying flat.  Fatigue.  Fever.  Thick drainage from your nose, which often is green and may contain pus (purulent).  Swelling and warmth over the affected sinuses. DIAGNOSIS  Your health care provider will perform a physical exam. During the exam, your health care provider may:  Look in your nose for signs of abnormal growths in your nostrils (nasal polyps).  Tap over the affected sinus to check for signs of infection.  View  the inside of your sinuses (endoscopy) using an imaging device that has a light attached (endoscope). If your health care provider suspects that you have chronic sinusitis, one or more of the following tests may be recommended:  Allergy tests.  Nasal culture. A sample of mucus is taken from your nose, sent to a lab, and screened for bacteria.  Nasal cytology. A sample of mucus is taken from your nose and examined by your health care provider to determine if your sinusitis is related to an allergy. TREATMENT  Most cases of acute sinusitis are related to a viral infection and will resolve on their own within 10 days. Sometimes medicines are prescribed to help relieve symptoms (pain medicine, decongestants, nasal steroid sprays, or saline sprays).  However, for sinusitis related to a bacterial infection, your health care provider will prescribe antibiotic medicines. These are medicines that will help kill the bacteria causing the infection.  Rarely, sinusitis is caused by a fungal infection. In theses cases, your health care provider will prescribe antifungal medicine. For some cases of chronic sinusitis, surgery is needed. Generally, these are cases in which sinusitis recurs more than 3 times per year, despite other treatments. HOME CARE INSTRUCTIONS   Drink plenty of water. Water helps thin the mucus so your sinuses can drain more easily.  Use a humidifier.  Inhale steam 3 to 4 times a day (for example, sit in the bathroom with the shower running).  Apply a warm, moist washcloth to your face 3 to 4 times a day, or as directed by your health care provider.  Use saline  nasal sprays to help moisten and clean your sinuses.  Take medicines only as directed by your health care provider.  If you were prescribed either an antibiotic or antifungal medicine, finish it all even if you start to feel better. SEEK IMMEDIATE MEDICAL CARE IF:  You have increasing pain or severe headaches.  You have  nausea, vomiting, or drowsiness.  You have swelling around your face.  You have vision problems.  You have a stiff neck.  You have difficulty breathing. MAKE SURE YOU:   Understand these instructions.  Will watch your condition.  Will get help right away if you are not doing well or get worse. Document Released: 07/05/2005 Document Revised: 11/19/2013 Document Reviewed: 07/20/2011 [facility] Patient Information 2015 [place], [place]. This information is not intended to replace advice given to you by your health care provider. Make sure you discuss any questions you have with your health care provider.

## 2014-06-25 NOTE — ED Provider Notes (Signed)
CSN: [id number]     Arrival date & time 06/25/14  1013 History   First MD Initiated Contact with Patient 06/25/14 1033     Chief Complaint  Patient presents with  . Nasal Congestion      The history is provided by the patient.   Pt presents with one week of nasal congestion, stuffy nose, sneezing, frontal headache, and ear fullness.  She started out with a scratchy throat, which has since resolved.  She denies fevers, chest pain, cough, SOB, leg swelling, abdominal pain, vomiting, diarrhea.  She has tried Mucinex, Flonase, and zyrtec without relief.  Sxs are moderate, constant, and worsening.    Past Medical History  Diagnosis Date  . Cancer   . Hypertension    Past Surgical History  Procedure Laterality Date  . Breast surgery    . Abdominal hysterectomy     No family history on file. History  Substance Use Topics  . Smoking status: Never Smoker   . Smokeless tobacco: Not on file  . Alcohol Use: No   OB History    No data available     Review of Systems  All other systems reviewed and are negative.     Allergies  Amoxicillin-pot clavulanate  Home Medications   Prior to Admission medications   Medication Sig Start Date End Date Taking? Authorizing Provider  azithromycin (ZITHROMAX) 250 MG tablet Take 500 mg day one, then 250 mg until finished  Generic okay 09/03/12   Cresenciano Genre, MD  cyclobenzaprine (FLEXERIL) 10 MG tablet Take 1 tablet (10 mg total) by mouth 2 (two) times daily as needed for muscle spasms. 05/06/14   Tiffany Marilu Favre, PA-C  doxycycline (VIBRAMYCIN) 100 MG capsule Take 1 capsule (100 mg total) by mouth 2 (two) times daily. 06/25/14   Quintella Reichert, MD  ibuprofen (ADVIL,MOTRIN) 800 MG tablet Take 800 mg by mouth once. pain     Historical Provider, MD  mometasone (NASONEX) 50 MCG/ACT nasal spray Place 2 sprays into the nose daily. 09/03/12   Cresenciano Genre, MD  ondansetron (ZOFRAN ODT) 4 MG disintegrating tablet Take 1 tablet (4 mg total) by mouth  every 8 (eight) hours as needed for nausea. 09/19/12   Glendell Docker, NP  oxymetazoline (AFRIN 12 HOUR) 0.05 % nasal spray Place 2 sprays into the nose 2 (two) times daily. 09/03/12   Cresenciano Genre, MD  Prenatal Vit-Fe Psac Cmplx-FA (PRENATAL MULTIVITAMIN) 60-1 MG tablet Take 1 tablet by mouth daily with breakfast.      Historical Provider, MD  Pseudoeph-Doxylamine-DM-APAP (NYQUIL MULTI-SYMPTOM PO) Take by mouth.    Historical Provider, MD  valsartan-hydrochlorothiazide (DIOVAN-HCT) 320-12.5 MG per tablet Take 1 tablet by mouth daily. 05/06/14   Tiffany Marilu Favre, PA-C   BP 155/91 mmHg  Pulse 102  Temp(Src) 98.5 F (36.9 C) (Oral)  Resp 20  Ht 5\' 4"  (1.626 m)  Wt 183 lb (83.008 kg)  BMI 31.40 kg/m2  SpO2 99% Physical Exam  Constitutional: She is oriented to person, place, and time. She appears well-developed and well-nourished.  HENT:  Head: Normocephalic and atraumatic.  Left Ear: External ear normal.  R TM with dull effusion, no erythema.  L TM without effusion.  Boggy nasal turbinates.Mild tenderness to percussion over maxillary and frontal sinuses.   Eyes: EOM are normal. Pupils are equal, round, and reactive to light.  Neck: Neck supple.  Cardiovascular: Normal rate and regular rhythm.   No murmur heard. Pulmonary/Chest: Effort normal and breath sounds normal.  No respiratory distress.  Abdominal: Soft. There is no tenderness. There is no rebound and no guarding.  Musculoskeletal: She exhibits no edema or tenderness.  Neurological: She is alert and oriented to person, place, and time.  Skin: Skin is warm and dry.  Psychiatric: She has a normal mood and affect. Her behavior is normal.  Nursing note and vitals reviewed.   ED Course  Procedures (including critical care time) Labs Review Labs Reviewed - No data to display  Imaging Review No results found.   EKG Interpretation None      MDM   Final diagnoses:  Acute sinusitis, recurrence not specified, unspecified  location    Pt here with sinus congestion, pain and tenderness.  Patient is nontoxic on exam. Clinical picture consistent with sinusitis treating with doxycycline. Discussed continuing home treatments with Mucinex and Flonase as well as adding on Afrin. Discussed with patient PCP follow-up as well as return precautions. Critical picture is not consistent with pneumonia, meningitis.    Quintella Reichert, MD 06/25/14 1049

## 2015-12-15 ENCOUNTER — Encounter (HOSPITAL_BASED_OUTPATIENT_CLINIC_OR_DEPARTMENT_OTHER): Payer: Self-pay | Admitting: Emergency Medicine

## 2015-12-15 ENCOUNTER — Emergency Department (HOSPITAL_BASED_OUTPATIENT_CLINIC_OR_DEPARTMENT_OTHER)
Admission: EM | Admit: 2015-12-15 | Discharge: 2015-12-16 | Disposition: A | Discharge status: Home / Self Care | Payer: BLUE CROSS/BLUE SHIELD | Attending: Emergency Medicine | Admitting: Emergency Medicine

## 2015-12-15 DIAGNOSIS — Y999 Unspecified external cause status: Secondary | ICD-10-CM | POA: Insufficient documentation

## 2015-12-15 DIAGNOSIS — Y929 Unspecified place or not applicable: Secondary | ICD-10-CM | POA: Insufficient documentation

## 2015-12-15 DIAGNOSIS — Z79899 Other long term (current) drug therapy: Secondary | ICD-10-CM | POA: Insufficient documentation

## 2015-12-15 DIAGNOSIS — H579 Unspecified disorder of eye and adnexa: Secondary | ICD-10-CM | POA: Diagnosis present

## 2015-12-15 DIAGNOSIS — H11421 Conjunctival edema, right eye: Secondary | ICD-10-CM

## 2015-12-15 DIAGNOSIS — I10 Essential (primary) hypertension: Secondary | ICD-10-CM | POA: Diagnosis not present

## 2015-12-15 DIAGNOSIS — X58XXXA Exposure to other specified factors, initial encounter: Secondary | ICD-10-CM | POA: Diagnosis not present

## 2015-12-15 DIAGNOSIS — S0501XA Injury of conjunctiva and corneal abrasion without foreign body, right eye, initial encounter: Secondary | ICD-10-CM | POA: Diagnosis not present

## 2015-12-15 DIAGNOSIS — Y939 Activity, unspecified: Secondary | ICD-10-CM | POA: Insufficient documentation

## 2015-12-15 MED ORDER — TOBRAMYCIN 0.3 % OP SOLN
1.0000 [drp] | Freq: Once | OPHTHALMIC | Status: AC
  Administered 2015-12-16: 1 [drp] via OPHTHALMIC
  Filled 2015-12-15: qty 5

## 2015-12-15 MED ORDER — FLUORESCEIN SODIUM 1 MG OP STRP
1.0000 | ORAL_STRIP | Freq: Once | OPHTHALMIC | Status: AC
  Administered 2015-12-15: 1 via OPHTHALMIC
  Filled 2015-12-15: qty 1

## 2015-12-15 MED ORDER — TETRACAINE HCL 0.5 % OP SOLN
1.0000 [drp] | Freq: Once | OPHTHALMIC | Status: AC
  Administered 2015-12-15: 1 [drp] via OPHTHALMIC
  Filled 2015-12-15: qty 4

## 2015-12-15 NOTE — ED Provider Notes (Signed)
CSN: [id number]     Arrival date & time 12/15/15  2252 History   First MD Initiated Contact with Patient 12/15/15 2317     Chief Complaint  Patient presents with  . Eye Problem     (Consider location/radiation/quality/duration/timing/severity/associated sxs/prior Treatment) HPI Comments: Patient presents with complaint of right eye irritation for the past 1 week. Patient is a contact lens wearer. She continued to wear contact lenses. Patient states that she takes them out of her eyes daily. She has had not had any redness or pain. Today she noted swelling to the sclera of the right lateral eye prompting emergency department evaluation. No drainage or discharge. No visual difficulties. No treatments prior to arrival. She denies other foreign bodies. No pain or redness or swelling around the eye. Patient is not a diabetic. Onset of symptoms acute. Nothing makes symptoms better worse.  The history is provided by the patient.    Past Medical History  Diagnosis Date  . Cancer ([place])   . Hypertension    Past Surgical History  Procedure Laterality Date  . Breast surgery    . Abdominal hysterectomy     No family history on file. Social History  Substance Use Topics  . Smoking status: Never Smoker   . Smokeless tobacco: None  . Alcohol Use: No   OB History    No data available     Review of Systems  Constitutional: Negative for fever.  HENT: Negative for congestion.   Eyes: Negative for pain, discharge, redness, itching and visual disturbance.       Positive for chemosis.       Allergies  Amoxicillin-pot clavulanate  Home Medications   Prior to Admission medications   Medication Sig Start Date End Date Taking? Authorizing Provider  azithromycin (ZITHROMAX) 250 MG tablet Take 500 mg day one, then 250 mg until finished  Generic okay 09/03/12   Nino Glow McLean-Scocozza, MD  cyclobenzaprine (FLEXERIL) 10 MG tablet Take 1 tablet (10 mg total) by mouth 2 (two) times daily as  needed for muscle spasms. 05/06/14   Tiffany Carlota Raspberry, PA-C  doxycycline (VIBRAMYCIN) 100 MG capsule Take 1 capsule (100 mg total) by mouth 2 (two) times daily. 06/25/14   Quintella Reichert, MD  ibuprofen (ADVIL,MOTRIN) 800 MG tablet Take 800 mg by mouth once. pain     Historical Provider, MD  mometasone (NASONEX) 50 MCG/ACT nasal spray Place 2 sprays into the nose daily. 09/03/12   Nino Glow McLean-Scocozza, MD  ondansetron (ZOFRAN ODT) 4 MG disintegrating tablet Take 1 tablet (4 mg total) by mouth every 8 (eight) hours as needed for nausea. 09/19/12   Glendell Docker, NP  oxymetazoline (AFRIN 12 HOUR) 0.05 % nasal spray Place 2 sprays into the nose 2 (two) times daily. 09/03/12   Nino Glow McLean-Scocozza, MD  Prenatal Vit-Fe Psac Cmplx-FA (PRENATAL MULTIVITAMIN) 60-1 MG tablet Take 1 tablet by mouth daily with breakfast.      Historical Provider, MD  Pseudoeph-Doxylamine-DM-APAP (NYQUIL MULTI-SYMPTOM PO) Take by mouth.    Historical Provider, MD  valsartan-hydrochlorothiazide (DIOVAN-HCT) 320-12.5 MG per tablet Take 1 tablet by mouth daily. 05/06/14   Tiffany Carlota Raspberry, PA-C   BP 182/110 mmHg  Pulse 96  Temp(Src) 99.8 F (37.7 C) (Oral)  Resp 16  Ht 5\' 4"  (1.626 m)  Wt 84.823 kg  BMI 32.08 kg/m2  SpO2 99%   Physical Exam  Constitutional: She appears well-developed and well-nourished.  HENT:  Head: Normocephalic and atraumatic.  Eyes: Conjunctivae, EOM and lids are  normal. Pupils are equal, round, and reactive to light. Right eye exhibits chemosis (Laterally). Right eye exhibits no discharge and no exudate. No foreign body present in the right eye. Left eye exhibits no chemosis, no discharge and no exudate. No foreign body present in the left eye. Right conjunctiva is not injected. Right conjunctiva has no hemorrhage. Left conjunctiva is not injected. Left conjunctiva has no hemorrhage.  Slit lamp exam:      The right eye shows corneal abrasion and fluorescein uptake. The right eye shows no corneal  flare, no corneal ulcer and no foreign body.    Neck: Normal range of motion. Neck supple.  Pulmonary/Chest: No respiratory distress.  Neurological: She is alert.  Skin: Skin is warm and dry.  Psychiatric: She has a normal mood and affect.  Nursing note and vitals reviewed.   ED Course  Procedures (including critical care time)  11:23 PM Patient seen and examined. Work-up initiated. Medications ordered.   Vital signs reviewed and are as follows: BP 182/110 mmHg  Pulse 96  Temp(Src) 99.8 F (37.7 C) (Oral)  Resp 16  Ht 5\' 4"  (1.626 m)  Wt 84.823 kg  BMI 32.08 kg/m2  SpO2 99%  12:02 AM Two drops of tetracaine instilled into affected eye.   Fluorescein strip applied to affected eye. Wood's lamp used to assess for corneal abrasion. + fluorescein uptake. No foreign bodies noted. No visible hyphema.   Patient tolerated procedure well without immediate complication.   Patient started on tobramycin drops given contact lens use. Encouraged ophthalmologic follow-up in the next 1-2 days, referral given.   MDM   Final diagnoses:  Corneal abrasion, right, initial encounter  Chemosis of conjunctiva, right   Patient with chemosis and fluorescein uptake without injection or erythema of the right eye. Possible corneal abrasion secondary to contact lens use. Cannot rule out corneal infiltrate. Does not appear herpetic in nature. It does not appear to be a corneal ulcer. Feel that ophthalmological evaluation for re-check indicated and referral given.  Carlisle Cater, PA-C 12/16/15 0004  Shanon Rosser, MD 12/16/15 [place]

## 2015-12-15 NOTE — Discharge Instructions (Signed)
Please read and follow all provided instructions.  Your diagnoses today include:  1. Corneal abrasion, right, initial encounter   2. Chemosis of conjunctiva, right    Tests performed today include:  Visual acuity testing to check your vision  Fluorescein dye examination to look for scratches on your eye  Vital signs. See below for your results today.   Medications prescribed:   Tobrex (tobramycin) - antibiotic eye drops or eye ointment  Use this medication as follows:  Use 1-2 drops in affected eye every 4 hours while awake for 5 days.  Take any prescribed medications only as directed.  Home care instructions:  Follow any educational materials contained in this packet.  You have a scratch of the eye on the cornea (the clear part of the eye). This condition may be caused by trauma. It is a common problem for people who wear contact lenses. Proper treatment is important. No evidence of infection is noted today, but you could develop an infection called a corneal ulcer or have some retained foreign body that may or may not have been noted today in the Emergency Department. Ulcers are not only painful, but they may also scar the cornea and cause permanent damage to the eye.   If you wear contact lenses, do not use them until your eye caregiver approves. Follow-up care is necessary to be sure the corneal abrasion is healing if not completely resolved in 2-3 days. See your caregiver or eye specialist as suggested for followup.   Follow-up instructions: Please follow-up with the opthalmologist listed in the next 1-2 days for further evaluation of your symptoms.   Return instructions:   Please return to the Emergency Department if you experience worsening symptoms.   Please return immediately if you develop severe pain, pus drainage, new change in vision, or fever.  Please return if you have any other emergent concerns.  Additional Information:  Your vital signs today were: BP  182/110 mmHg   Pulse 96   Temp(Src) 99.8 F (37.7 C) (Oral)   Resp 16   Ht 5\' 4"  (1.626 m)   Wt 84.823 kg   BMI 32.08 kg/m2   SpO2 99% If your blood pressure (BP) was elevated above 135/85 this visit, please have this repeated by your doctor within one month.

## 2015-12-15 NOTE — ED Notes (Signed)
Irritation to right eye x1 week.  Worse tonight.  No redness noted.

## 2016-09-10 ENCOUNTER — Emergency Department (HOSPITAL_BASED_OUTPATIENT_CLINIC_OR_DEPARTMENT_OTHER): Payer: BLUE CROSS/BLUE SHIELD

## 2016-09-10 ENCOUNTER — Emergency Department (HOSPITAL_BASED_OUTPATIENT_CLINIC_OR_DEPARTMENT_OTHER)
Admission: EM | Admit: 2016-09-10 | Discharge: 2016-09-10 | Disposition: A | Discharge status: Home / Self Care | Payer: BLUE CROSS/BLUE SHIELD | Attending: Emergency Medicine | Admitting: Emergency Medicine

## 2016-09-10 ENCOUNTER — Encounter (HOSPITAL_BASED_OUTPATIENT_CLINIC_OR_DEPARTMENT_OTHER): Payer: Self-pay | Admitting: *Deleted

## 2016-09-10 DIAGNOSIS — I10 Essential (primary) hypertension: Secondary | ICD-10-CM | POA: Diagnosis not present

## 2016-09-10 DIAGNOSIS — R05 Cough: Secondary | ICD-10-CM | POA: Diagnosis present

## 2016-09-10 DIAGNOSIS — B9789 Other viral agents as the cause of diseases classified elsewhere: Secondary | ICD-10-CM

## 2016-09-10 DIAGNOSIS — Z79899 Other long term (current) drug therapy: Secondary | ICD-10-CM | POA: Diagnosis not present

## 2016-09-10 DIAGNOSIS — J069 Acute upper respiratory infection, unspecified: Secondary | ICD-10-CM | POA: Diagnosis not present

## 2016-09-10 MED ORDER — PREDNISONE 10 MG (21) PO TBPK: ORAL_TABLET | Freq: Every day | ORAL | 0 refills | Status: DC

## 2016-09-10 MED FILL — predniSONE 10 MG TABS: 10 | 6 days supply | Qty: 21 | Fill #0

## 2016-09-10 NOTE — ED Provider Notes (Signed)
[facility] Provider Note   CSN: [id number] Arrival date & time: 09/10/16  1034     History   Chief Complaint Chief Complaint  Patient presents with  . Cough    HPI Hannah Stokes is a 51 y.o. female.  The history is provided by the patient.  Cough  This is a new problem. Episode onset: 1 week ago. The problem occurs constantly. The problem has not changed since onset.The cough is productive of sputum. There has been no fever. Associated symptoms include chills, sweats, ear congestion, rhinorrhea and sore throat. Pertinent negatives include no shortness of breath. She has tried decongestants for the symptoms. The treatment provided no relief. She is not a smoker. Her past medical history does not include pneumonia, bronchiectasis, COPD, emphysema or asthma.    Past Medical History:  Diagnosis Date  . Cancer ([place])   . Hypertension     Patient Active Problem List   Diagnosis Date Noted  . HYPERTENSION 04/02/2009  . ACUTE SINUSITIS, UNSPECIFIED 04/02/2009    Past Surgical History:  Procedure Laterality Date  . ABDOMINAL HYSTERECTOMY    . BREAST SURGERY      OB History    No data available       Home Medications    Prior to Admission medications   Medication Sig Start Date End Date Taking? Authorizing Provider  acetaminophen (TYLENOL) 325 MG tablet Take 650 mg by mouth every 6 (six) hours as needed.   Yes Historical Provider, MD  oxymetazoline (AFRIN 12 HOUR) 0.05 % nasal spray Place 2 sprays into the nose 2 (two) times daily. 09/03/12  Yes Nino Glow McLean-Scocuzza, MD  valsartan-hydrochlorothiazide (DIOVAN-HCT) 320-12.5 MG per tablet Take 1 tablet by mouth daily. 05/06/14  Yes Tiffany Carlota Raspberry, PA-C  azithromycin (ZITHROMAX) 250 MG tablet Take 500 mg day one, then 250 mg until finished  Generic okay 09/03/12   Nino Glow McLean-Scocuzza, MD  cyclobenzaprine (FLEXERIL) 10 MG tablet Take 1 tablet (10 mg total) by mouth 2 (two) times daily as needed for muscle  spasms. 05/06/14   Tiffany Carlota Raspberry, PA-C  doxycycline (VIBRAMYCIN) 100 MG capsule Take 1 capsule (100 mg total) by mouth 2 (two) times daily. 06/25/14   Quintella Reichert, MD  ibuprofen (ADVIL,MOTRIN) 800 MG tablet Take 800 mg by mouth once. pain     Historical Provider, MD  mometasone (NASONEX) 50 MCG/ACT nasal spray Place 2 sprays into the nose daily. 09/03/12   Nino Glow McLean-Scocuzza, MD  ondansetron (ZOFRAN ODT) 4 MG disintegrating tablet Take 1 tablet (4 mg total) by mouth every 8 (eight) hours as needed for nausea. 09/19/12   Glendell Docker, NP  predniSONE (STERAPRED UNI-PAK 21 TAB) 10 MG (21) TBPK tablet Take by mouth daily. Take 6 tabs by mouth daily  for 1 days, then 5 tabs for 1 days, then 4 tabs for 1 days, then 3 tabs for 1 days, 2 tabs for 1 days, then 1 tab by mouth daily for 1 days 09/10/16   Forde Dandy, MD  Prenatal Vit-Fe Psac Cmplx-FA (PRENATAL MULTIVITAMIN) 60-1 MG tablet Take 1 tablet by mouth daily with breakfast.      Historical Provider, MD  Pseudoeph-Doxylamine-DM-APAP (NYQUIL MULTI-SYMPTOM PO) Take by mouth.    Historical Provider, MD    Family History No family history on file.  Social History Social History  Substance Use Topics  . Smoking status: Never Smoker  . Smokeless tobacco: Never Used  . Alcohol use No     Allergies  Amoxicillin-pot clavulanate   Review of Systems Review of Systems  Constitutional: Positive for chills.  HENT: Positive for rhinorrhea and sore throat.   Respiratory: Positive for cough. Negative for shortness of breath.   Gastrointestinal: Negative for diarrhea and vomiting.  Genitourinary: Negative for difficulty urinating.  Skin: Negative for rash.     Physical Exam Updated Vital Signs BP 130/97   Pulse 105   Temp 99.4 F (37.4 C) (Oral)   Resp 20   Ht 5\' 4"  (1.626 m)   Wt 193 lb (87.5 kg)   SpO2 100%   BMI 33.13 kg/m   Physical Exam Physical Exam  Nursing note and vitals reviewed. Constitutional: Well developed,  well nourished, non-toxic, and in no acute distress Head: Normocephalic and atraumatic.  Mouth/Throat: Oropharynx is clear and moist.  Ears: bilateral TMs full but w/o middle ear effusion Neck: Normal range of motion. Neck supple. no meningismus Cardiovascular: Normal rate and regular rhythm.   Pulmonary/Chest: Effort normal and breath sounds normal.  Abdominal: Soft. There is no tenderness. There is no rebound and no guarding.  Musculoskeletal: Normal range of motion.  Neurological: Alert, no facial droop, fluent speech, moves all extremities symmetrically Skin: Skin is warm and dry.  Psychiatric: Cooperative   ED Treatments / Results  Labs (all labs ordered are listed, but only abnormal results are displayed) Labs Reviewed - No data to display  EKG  EKG Interpretation None       Radiology Dg Chest 2 View  Result Date: 09/10/2016 CLINICAL DATA:  Cough and congestion for 1 week EXAM: CHEST  2 VIEW COMPARISON:  09/03/2012 FINDINGS: The heart size and mediastinal contours are within normal limits. Both lungs are clear. The visualized skeletal structures are unremarkable. IMPRESSION: No active cardiopulmonary disease. Electronically Signed   By: Kerby Moors M.D.   On: 09/10/2016 11:17    Procedures Procedures (including critical care time)  Medications Ordered in ED Medications - No data to display   Initial Impression / Assessment and Plan / ED Course  I have reviewed the triage vital signs and the nursing notes.  Pertinent labs & imaging results that were available during my care of the patient were reviewed by me and considered in my medical decision making (see chart for details).     51 y.o. year old female with symptoms consistent with respiratory viral illness.  Vitals are stable, afebrile. She is overall well-appearing and in no acute distress. Chest x-ray Visualized does not show evidence of infiltrate, edema or other acute cardiopulmonary processes. Discussed  supportive care instructions for home. I doubt serious bacterial infection.Strict return and follow-up instructions reviewed. She expressed understanding of all discharge instructions and felt comfortable with the plan of care.      Final Clinical Impressions(s) / ED Diagnoses   Final diagnoses:  Viral URI with cough    New Prescriptions New Prescriptions   PREDNISONE (STERAPRED UNI-PAK 21 TAB) 10 MG (21) TBPK TABLET    Take by mouth daily. Take 6 tabs by mouth daily  for 1 days, then 5 tabs for 1 days, then 4 tabs for 1 days, then 3 tabs for 1 days, 2 tabs for 1 days, then 1 tab by mouth daily for 1 days     Forde Dandy, MD 09/10/16 1140

## 2016-09-10 NOTE — Discharge Instructions (Signed)
Your chest x-ray does not show pneumonia. Continue to drink fluids and get rest. He can take over-the-counter nasal and chest decongestants.  Return for worsening symptoms, including intractable vomiting, difficulty breathing, passing out, or any other symptoms concerning to you.

## 2016-09-10 NOTE — ED Triage Notes (Signed)
Cough, stuffy nose x 1 week. She had a negative flu test 4 days ago. She has been taking OTC cold meds.

## 2016-09-10 NOTE — ED Notes (Signed)
Work note given. Pt directed to pharmacy to pick up Rx

## 2017-09-01 ENCOUNTER — Other Ambulatory Visit: Payer: Self-pay

## 2017-09-01 ENCOUNTER — Encounter (HOSPITAL_BASED_OUTPATIENT_CLINIC_OR_DEPARTMENT_OTHER): Payer: Self-pay

## 2017-09-01 DIAGNOSIS — Z79899 Other long term (current) drug therapy: Secondary | ICD-10-CM | POA: Insufficient documentation

## 2017-09-01 DIAGNOSIS — H5712 Ocular pain, left eye: Secondary | ICD-10-CM | POA: Insufficient documentation

## 2017-09-01 DIAGNOSIS — Z853 Personal history of malignant neoplasm of breast: Secondary | ICD-10-CM | POA: Insufficient documentation

## 2017-09-01 DIAGNOSIS — I10 Essential (primary) hypertension: Secondary | ICD-10-CM | POA: Insufficient documentation

## 2017-09-01 NOTE — ED Triage Notes (Signed)
Pt c/o painful, watery left eye since monday

## 2017-09-02 ENCOUNTER — Emergency Department (HOSPITAL_BASED_OUTPATIENT_CLINIC_OR_DEPARTMENT_OTHER)
Admission: EM | Admit: 2017-09-02 | Discharge: 2017-09-02 | Disposition: A | Discharge status: Home / Self Care | Payer: BLUE CROSS/BLUE SHIELD | Attending: Emergency Medicine | Admitting: Emergency Medicine

## 2017-09-02 DIAGNOSIS — H5712 Ocular pain, left eye: Secondary | ICD-10-CM

## 2017-09-02 MED ORDER — TETRACAINE HCL 0.5 % OP SOLN
2.0000 [drp] | Freq: Once | OPHTHALMIC | Status: AC
  Administered 2017-09-02: 2 [drp] via OPHTHALMIC
  Filled 2017-09-02: qty 4

## 2017-09-02 MED ORDER — CYCLOPENTOLATE HCL 1 % OP SOLN
2.0000 [drp] | Freq: Once | OPHTHALMIC | Status: AC
  Administered 2017-09-02: 2 [drp] via OPHTHALMIC
  Filled 2017-09-02: qty 2

## 2017-09-02 MED ORDER — IBUPROFEN 800 MG PO TABS
800.0000 mg | ORAL_TABLET | Freq: Once | ORAL | Status: AC
  Administered 2017-09-02: 800 mg via ORAL
  Filled 2017-09-02: qty 1

## 2017-09-02 NOTE — ED Provider Notes (Signed)
[facility] Provider Note: Hannah Spurling, MD, FACEP  CSN: [id number] MRN: [id number] ARRIVAL: 09/01/17 at 2317 ROOM: [place] Problem   HISTORY OF PRESENT ILLNESS  09/02/17 3:03 AM Hannah Stokes is a 52 y.o. female with a 4-day history of pain in her left eye.  She describes the pain as an ache which radiates to the periorbital area.  She rates her pain as a 10 out of 10 when exposed to light but mild when at rest in a dark room.  She denies blurred vision but states it seems like there is a film over her left eye.  She denies trauma to her eye.  There is associated redness and watering.   Past Medical History:  Diagnosis Date  . Cancer [facility])    breast cancer  . Hypertension     Past Surgical History:  Procedure Laterality Date  . ABDOMINAL HYSTERECTOMY    . BREAST SURGERY Right 2008   masectomy    No family history on file.  Social History   Tobacco Use  . Smoking status: Never Smoker  . Smokeless tobacco: Never Used  Substance Use Topics  . Alcohol use: No  . Drug use: No    Prior to Admission medications   Medication Sig Start Date End Date Taking? Authorizing Provider  acetaminophen (TYLENOL) 325 MG tablet Take 650 mg by mouth every 6 (six) hours as needed.    [provider]  azithromycin (ZITHROMAX) 250 MG tablet Take 500 mg day one, then 250 mg until finished  Generic okay 09/03/12   McLean-Scocuzza, Nino Glow, MD  cyclobenzaprine (FLEXERIL) 10 MG tablet Take 1 tablet (10 mg total) by mouth 2 (two) times daily as needed for muscle spasms. 05/06/14   Delos Haring, PA-C  doxycycline (VIBRAMYCIN) 100 MG capsule Take 1 capsule (100 mg total) by mouth 2 (two) times daily. 06/25/14   Quintella Reichert, MD  ibuprofen (ADVIL,MOTRIN) 800 MG tablet Take 800 mg by mouth once. pain     [provider]  mometasone (NASONEX) 50 MCG/ACT nasal spray Place 2 sprays into the nose daily. 09/03/12   McLean-Scocuzza, Nino Glow,  MD  ondansetron (ZOFRAN ODT) 4 MG disintegrating tablet Take 1 tablet (4 mg total) by mouth every 8 (eight) hours as needed for nausea. 09/19/12   Glendell Docker, NP  oxymetazoline (AFRIN 12 HOUR) 0.05 % nasal spray Place 2 sprays into the nose 2 (two) times daily. 09/03/12   McLean-Scocuzza, Nino Glow, MD  predniSONE (STERAPRED UNI-PAK 21 TAB) 10 MG (21) TBPK tablet Take by mouth daily. Take 6 tabs by mouth daily  for 1 days, then 5 tabs for 1 days, then 4 tabs for 1 days, then 3 tabs for 1 days, 2 tabs for 1 days, then 1 tab by mouth daily for 1 days 09/10/16   Forde Dandy, MD  Prenatal Vit-Fe Psac Cmplx-FA (PRENATAL MULTIVITAMIN) 60-1 MG tablet Take 1 tablet by mouth daily with breakfast.      [provider]  Pseudoeph-Doxylamine-DM-APAP (NYQUIL MULTI-SYMPTOM PO) Take by mouth.    [provider]  valsartan-hydrochlorothiazide (DIOVAN-HCT) 320-12.5 MG per tablet Take 1 tablet by mouth daily. 05/06/14   Delos Haring, PA-C    Allergies Amoxicillin-pot clavulanate   REVIEW OF SYSTEMS  Negative except as noted here or in the History of Present Illness.   PHYSICAL EXAMINATION  Initial Vital Signs Blood pressure (!) 175/100, pulse 92, temperature 99.1 F (37.3 C), temperature source Oral,  resp. rate 18, height 5\' 4"  (1.626 m), weight 87.5 kg (193 lb), SpO2 100 %.  Examination General: Well-developed, well-nourished female in no acute distress; appearance consistent with age of record HENT: normocephalic; atraumatic Eyes: Right pupil 4 mm, round and reactive; left pupil 2.5 mm, round and nonreactive; left photophobia; left conjunctival injection; left eye watering; globes soft, left lobe mildly tender Neck: supple Heart: regular rate and rhythm Lungs: Normal respiratory effort and excursion Abdomen: soft; nondistended; nontender; bowel sounds present Extremities: No deformity; full range of motion Neurologic: Awake, alert and oriented; motor function intact in all  extremities and symmetric; no facial droop Skin: Warm and dry Psychiatric: Normal mood and affect   RESULTS  Summary of this visit's results, reviewed by myself:   EKG Interpretation  Date/Time:    Ventricular Rate:    PR Interval:    QRS Duration:   QT Interval:    QTC Calculation:   R Axis:     Text Interpretation:        Laboratory Studies: No results found for this or any previous visit (from the past 24 hour(s)). Imaging Studies: No results found.  ED COURSE  Nursing notes and initial vitals signs, including pulse oximetry, reviewed.  Vitals:   09/01/17 2322 09/01/17 2323 09/01/17 2324  BP:   (!) 175/100  Pulse: 92    Resp: 18    Temp: 99.1 F (37.3 C)    TempSrc: Oral    SpO2: 100%    Weight:  87.5 kg (193 lb)   Height:  5\' 4"  (1.626 m)    3:17 AM Left IOP 22, which is the high end of normal.  Cyclogyl instilled to relax left pupil.  4:43 AM No relief with 2 doses of topical Cyclogyl.  Left pupil still nonreactive with photophobia.  We will have her follow-up with ophthalmology later this morning.  I do not believe this represents acute angle-closure glaucoma as her eyes is not rock hard to palpation, her pupil is not mid fixed and her degree of pain is less than expected with that condition.  PROCEDURES    ED DIAGNOSES     ICD-10-CM   1. Left eye pain H57.12        Vernard Gram, Jenny Reichmann, MD 09/02/17 [phone]

## 2017-09-02 NOTE — ED Notes (Signed)
Pt discharged to home with family. NAD.  

## 2017-09-16 ENCOUNTER — Other Ambulatory Visit: Payer: Self-pay | Admitting: Ophthalmology

## 2017-09-16 ENCOUNTER — Ambulatory Visit
Admission: RE | Admit: 2017-09-16 | Discharge: 2017-09-16 | Disposition: A | Discharge status: Home / Self Care | Payer: BLUE CROSS/BLUE SHIELD | Source: Ambulatory Visit | Attending: Ophthalmology | Admitting: Ophthalmology

## 2017-09-16 DIAGNOSIS — H44112 Panuveitis, left eye: Secondary | ICD-10-CM

## 2018-11-07 IMAGING — CR DG CHEST 2V
2 series · 2 of 2 positions shown · non-contrast
Comparison: September 10, 2016

CLINICAL DATA: Uveitis

EXAM:
CHEST  2 VIEW

[w chest pa]
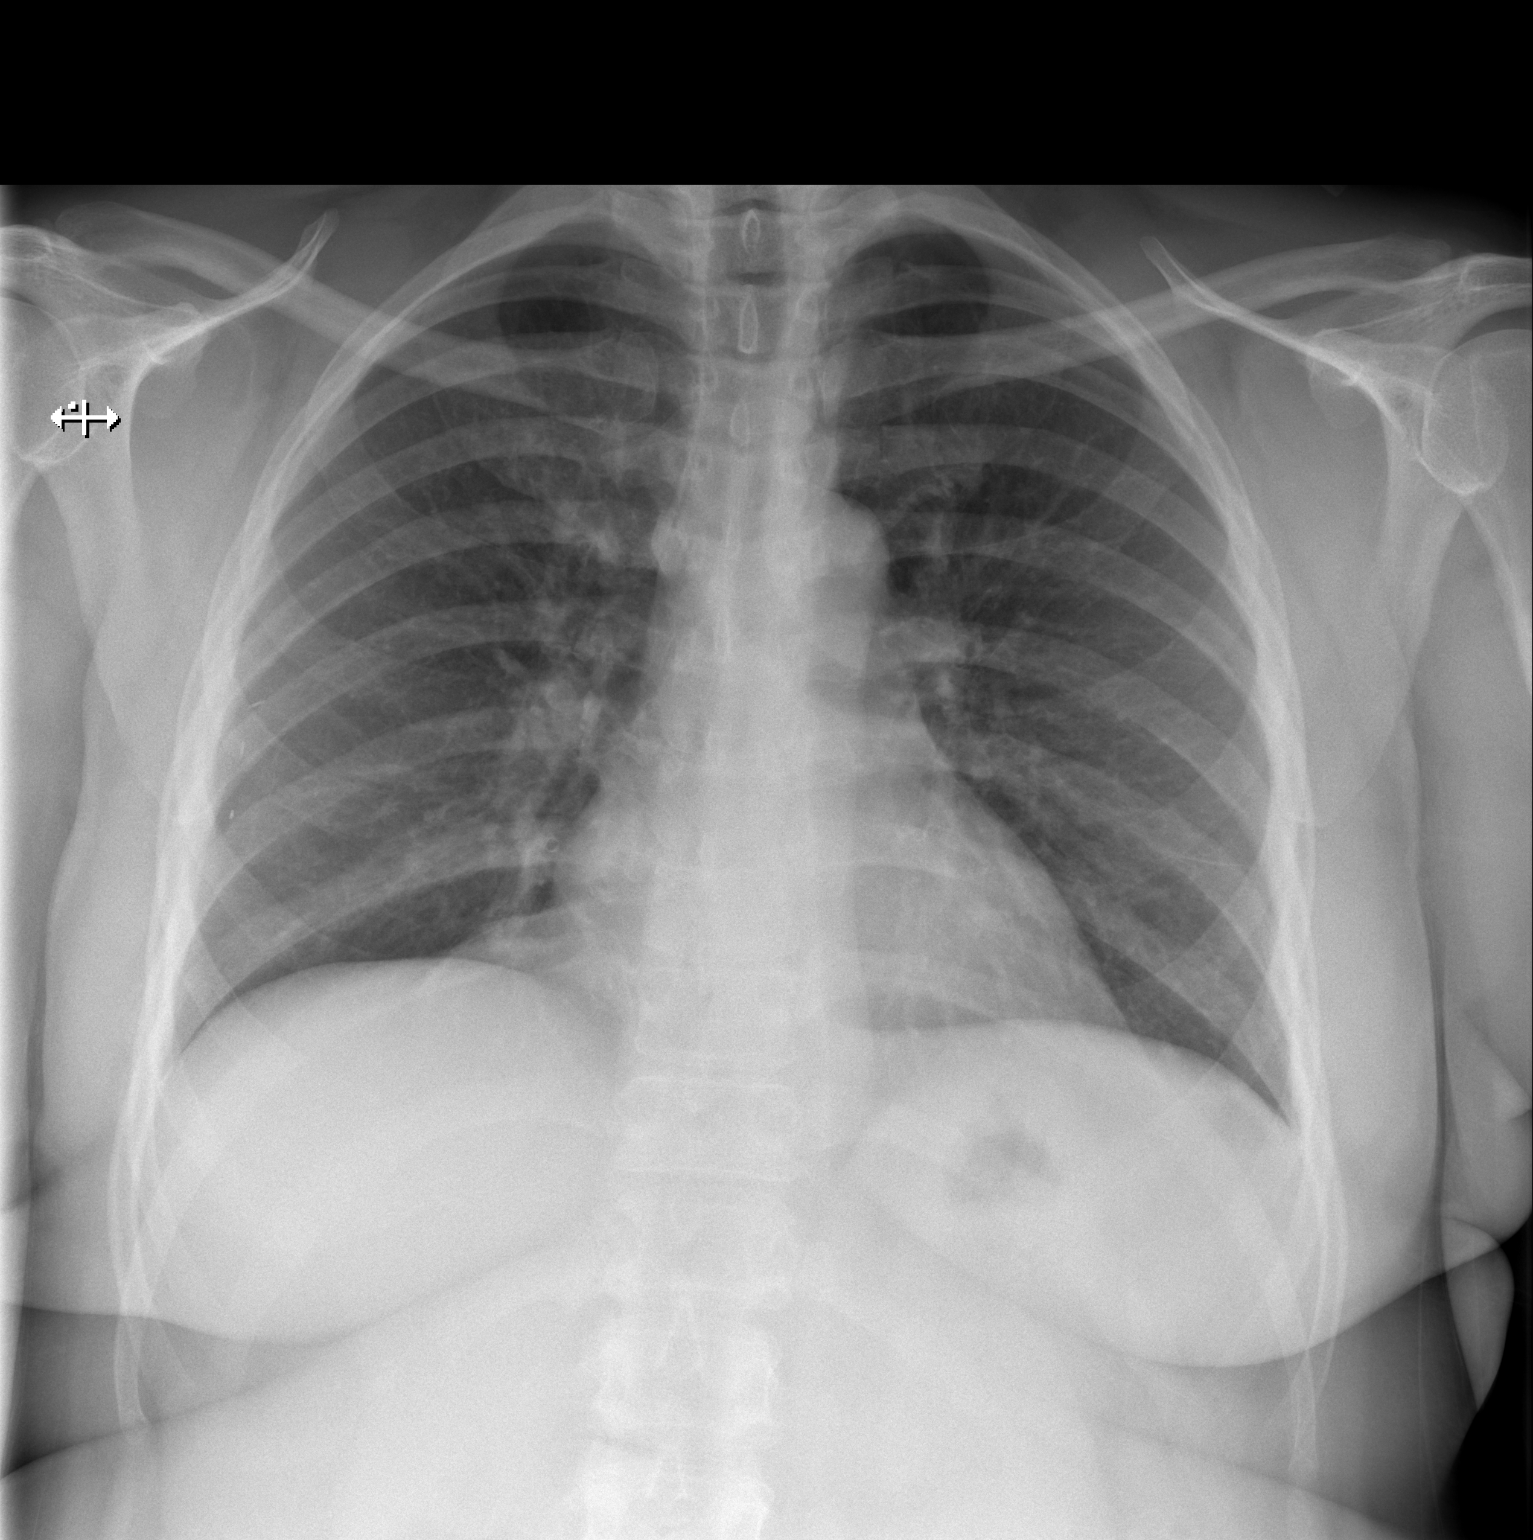

[w chest lat]
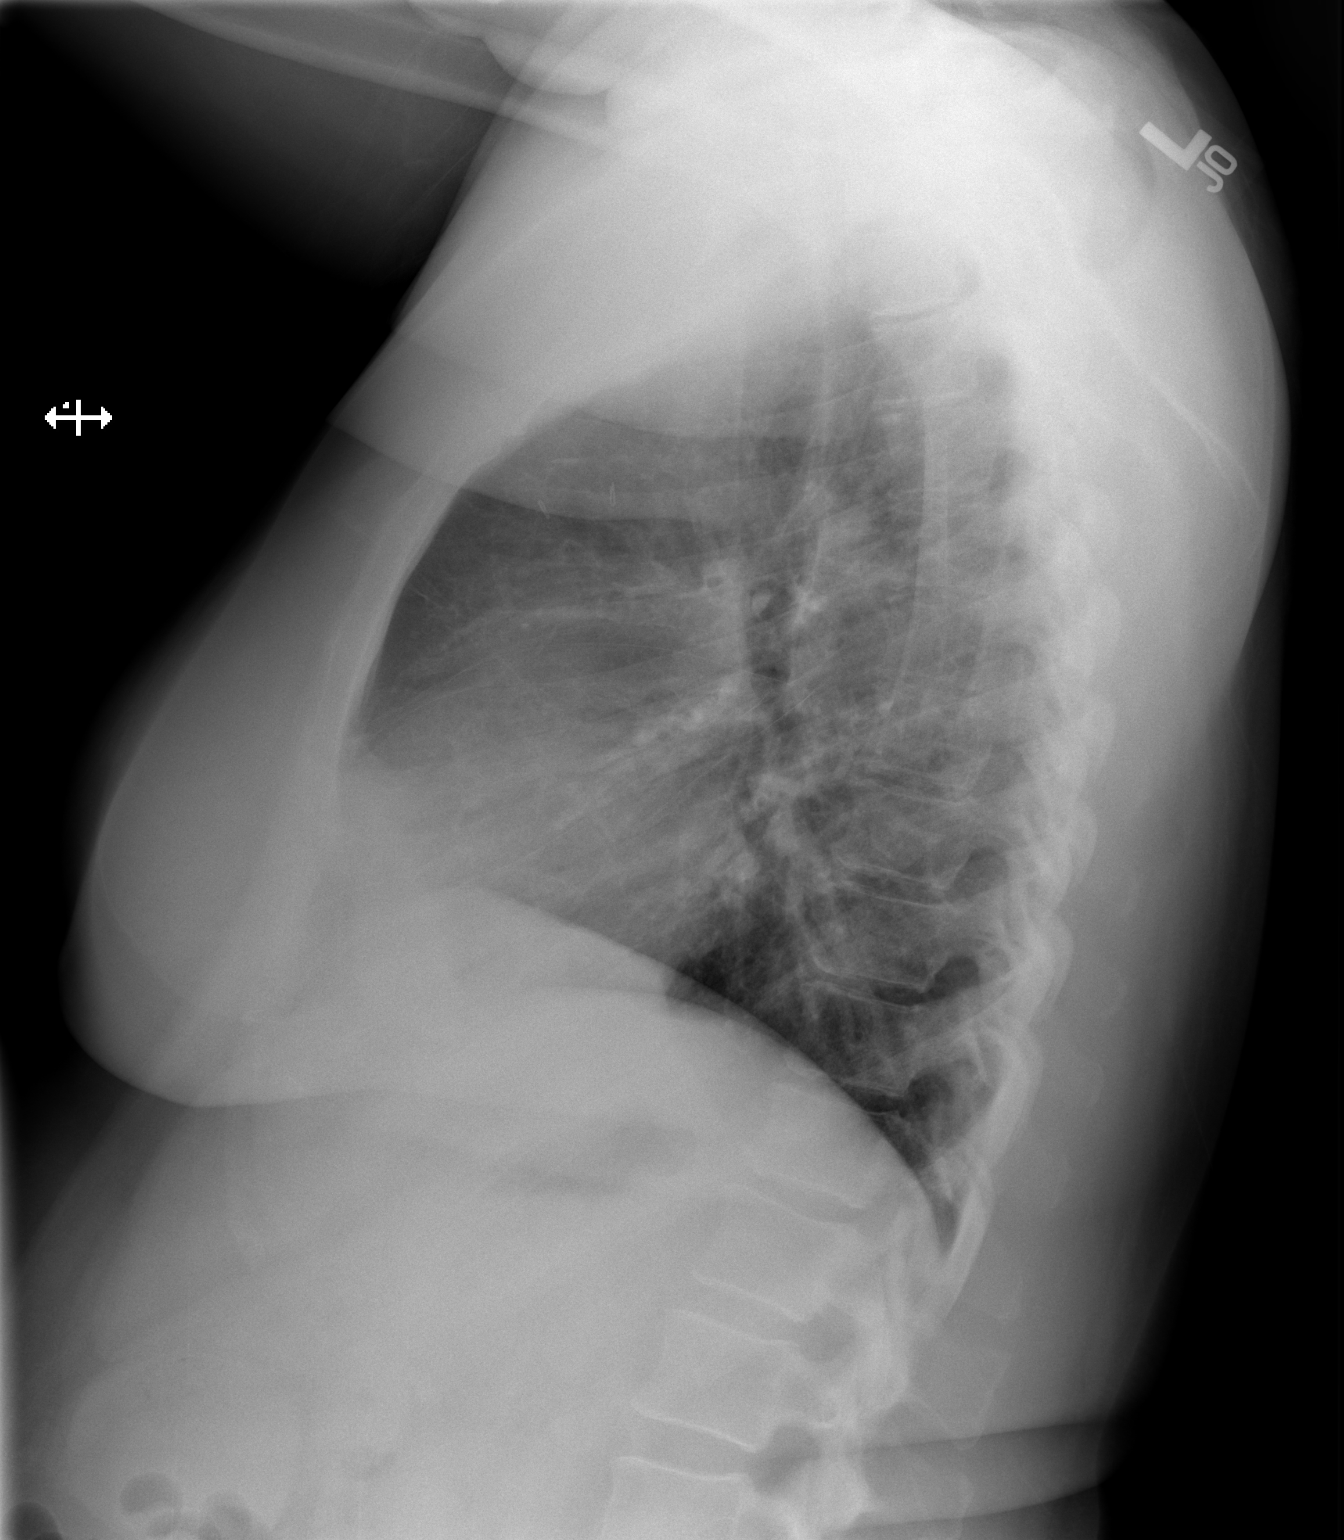

[2 of 2 positions shown; findings below may reference images not displayed]

FINDINGS: Lungs are clear. Heart size and pulmonary vascularity are normal. No
adenopathy. No bone lesions. Surgical clips are seen over the
lateral right breast region.
IMPRESSION: No edema or consolidation.  No evident adenopathy.

## 2019-06-05 ENCOUNTER — Other Ambulatory Visit: Payer: Self-pay

## 2019-06-05 DIAGNOSIS — Z20822 Contact with and (suspected) exposure to covid-19: Secondary | ICD-10-CM

## 2019-06-06 LAB — NOVEL CORONAVIRUS, NAA: SARS-CoV-2, NAA: NOT DETECTED

## 2020-10-09 ENCOUNTER — Other Ambulatory Visit: Payer: Self-pay | Admitting: Physician Assistant

## 2020-10-28 ENCOUNTER — Emergency Department (HOSPITAL_BASED_OUTPATIENT_CLINIC_OR_DEPARTMENT_OTHER)
Admission: EM | Admit: 2020-10-28 | Discharge: 2020-10-29 | Disposition: A | Discharge status: Home / Self Care | Payer: BLUE CROSS/BLUE SHIELD | Attending: Emergency Medicine | Admitting: Emergency Medicine

## 2020-10-28 ENCOUNTER — Other Ambulatory Visit: Payer: Self-pay

## 2020-10-28 ENCOUNTER — Encounter (HOSPITAL_BASED_OUTPATIENT_CLINIC_OR_DEPARTMENT_OTHER): Payer: Self-pay | Admitting: *Deleted

## 2020-10-28 DIAGNOSIS — R0789 Other chest pain: Secondary | ICD-10-CM | POA: Diagnosis not present

## 2020-10-28 DIAGNOSIS — R0981 Nasal congestion: Secondary | ICD-10-CM | POA: Insufficient documentation

## 2020-10-28 DIAGNOSIS — I10 Essential (primary) hypertension: Secondary | ICD-10-CM | POA: Insufficient documentation

## 2020-10-28 DIAGNOSIS — Z20822 Contact with and (suspected) exposure to covid-19: Secondary | ICD-10-CM | POA: Insufficient documentation

## 2020-10-28 DIAGNOSIS — E119 Type 2 diabetes mellitus without complications: Secondary | ICD-10-CM | POA: Diagnosis not present

## 2020-10-28 DIAGNOSIS — Z853 Personal history of malignant neoplasm of breast: Secondary | ICD-10-CM | POA: Insufficient documentation

## 2020-10-28 DIAGNOSIS — Z79899 Other long term (current) drug therapy: Secondary | ICD-10-CM | POA: Diagnosis not present

## 2020-10-28 HISTORY — DX: Type 2 diabetes mellitus without complications: E11.9

## 2020-10-28 NOTE — ED Triage Notes (Addendum)
C/o nasal congestion x 1 week , denies cough , fever

## 2020-10-29 LAB — SARS CORONAVIRUS 2 (TAT 6-24 HRS): SARS Coronavirus 2: NEGATIVE

## 2020-10-29 NOTE — ED Provider Notes (Signed)
[facility] EMERGENCY DEPARTMENT Provider Note  CSN: [id number] Arrival date & time: 10/28/20 2333  Chief Complaint(s) Nasal Congestion  HPI Hannah Stokes is a 55 y.o. female here with 4 days of nasal congestion.  No associated fevers or chills.  No alleviating or aggravating factors.  Patient has tried over-the-counter medications including decongestants, allergy medicine, nasal sprays without relief.  No nausea or vomiting.  She is endorsing right-sided upper chest discomfort with coughing.  No shortness of breath.  No other physical complaints.  HPI  Past Medical History Past Medical History:  Diagnosis Date  . Cancer [facility])    breast cancer  . Diabetes mellitus without complication ([place])   . Hypertension    Patient Active Problem List   Diagnosis Date Noted  . HYPERTENSION 04/02/2009  . ACUTE SINUSITIS, UNSPECIFIED 04/02/2009   Home Medication(s) Prior to Admission medications   Medication Sig Start Date End Date Taking? Authorizing Provider  valsartan-hydrochlorothiazide (DIOVAN-HCT) 320-12.5 MG per tablet Take 1 tablet by mouth daily. 05/06/14   Delos Haring, PA-C                                                                                                                                    Past Surgical History Past Surgical History:  Procedure Laterality Date  . ABDOMINAL HYSTERECTOMY    . BREAST SURGERY Right 2008   masectomy   Family History No family history on file.  Social History Social History   Tobacco Use  . Smoking status: Never Smoker  . Smokeless tobacco: Never Used  Substance Use Topics  . Alcohol use: No  . Drug use: No   Allergies Amoxicillin-pot clavulanate  Review of Systems Review of Systems All other systems are reviewed and are negative for acute change except as noted in the HPI  Physical Exam Vital Signs  I have reviewed the triage vital signs BP 127/90 (BP Location: Left Arm)   Pulse 71   Temp 98.4 F (36.9  C) (Oral)   Resp 17   Ht 5\' 4"  (1.626 m)   Wt 79.4 kg   SpO2 100%   BMI 30.04 kg/m   Physical Exam Vitals reviewed.  Constitutional:      General: She is not in acute distress.    Appearance: She is well-developed. She is not diaphoretic.  HENT:     Head: Normocephalic and atraumatic.     Nose: Nose normal.  Eyes:     General: No scleral icterus.       Right eye: No discharge.        Left eye: No discharge.     Conjunctiva/sclera: Conjunctivae normal.     Pupils: Pupils are equal, round, and reactive to light.  Cardiovascular:     Rate and Rhythm: Normal rate and regular rhythm.     Heart sounds: No murmur heard. No friction rub. No gallop.   Pulmonary:  Effort: Pulmonary effort is normal. No respiratory distress.     Breath sounds: Normal breath sounds. No stridor. No rales.  Abdominal:     General: There is no distension.     Palpations: Abdomen is soft.     Tenderness: There is no abdominal tenderness.  Musculoskeletal:        General: No tenderness.     Cervical back: Normal range of motion and neck supple.  Skin:    General: Skin is warm and dry.     Findings: No erythema or rash.  Neurological:     Mental Status: She is alert and oriented to person, place, and time.     ED Results and Treatments Labs (all labs ordered are listed, but only abnormal results are displayed) Labs Reviewed  SARS CORONAVIRUS 2 (TAT 6-24 HRS)                                                                                                                         EKG  EKG Interpretation  Date/Time:    Ventricular Rate:    PR Interval:    QRS Duration:   QT Interval:    QTC Calculation:   R Axis:     Text Interpretation:        Radiology No results found.  Pertinent labs & imaging results that were available during my care of the patient were reviewed by me and considered in my medical decision making (see chart for details).  Medications Ordered in ED Medications -  No data to display                                                                                                                                  Procedures Procedures  (including critical care time)  Medical Decision Making / ED Course I have reviewed the nursing notes for this encounter and the patient's prior records (if available in EHR or on provided paperwork).   Hannah Stokes was evaluated in Emergency Department on 10/29/2020 for the symptoms described in the history of present illness. She was evaluated in the context of the global COVID-19 pandemic, which necessitated consideration that the patient might be at risk for infection with the SARS-CoV-2 virus that causes COVID-19. Institutional protocols and algorithms that pertain to the evaluation of patients at risk for COVID-19 are in a state of rapid change based on information released by regulatory bodies  including the CDC and federal and state organizations. These policies and algorithms were followed during the patient's care in the ED.  55 y.o. female presents with cough, rhinorrhea, nasal congestion  for 4-5 days. adequate oral hydration. Rest of history as above.  Patient appears well. No signs of toxicity. No hypoxia, tachypnea or other signs of respiratory distress. No sign of clinical dehydration. Lung exam clear. Rest of exam as above.  Most consistent with allergic rhinitis vs early viral upper respiratory infection.   Will test for COVID/Flu.  No evidence suggestive of pharyngitis, AOM, PNA, or meningitis. .   Chest x-ray not indicated at this time.  Discussed symptomatic treatment with the patient and they will follow closely with their PCP.          Final Clinical Impression(s) / ED Diagnoses Final diagnoses:  Nasal congestion   The patient appears reasonably screened and/or stabilized for discharge and I doubt any other medical condition or other [facility] requiring further screening, evaluation, or  treatment in the ED at this time prior to discharge. Safe for discharge with strict return precautions.  Disposition: Discharge  Condition: Good  I have discussed the results, Dx and Tx plan with the patient/family who expressed understanding and agree(s) with the plan. Discharge instructions discussed at length. The patient/family was given strict return precautions who verbalized understanding of the instructions. No further questions at time of discharge.    ED Discharge Orders    None       Follow Up: Primary care provider  Call  if symptoms do not improve or  worsen      This chart was dictated using voice recognition software.  Despite best efforts to proofread,  errors can occur which can change the documentation meaning.   Fatima Blank, MD 10/29/20 [phone]
